# Patient Record
Sex: Female | Born: 1935 | Race: White | Hispanic: No | Smoking: Former smoker
Health system: Southern US, Community
[De-identification: ages and names within clinical notes are randomized; demographics above are authoritative.]

## PROBLEM LIST (undated history)

## (undated) DIAGNOSIS — F329 Major depressive disorder, single episode, unspecified: Secondary | ICD-10-CM

## (undated) DIAGNOSIS — F32A Depression, unspecified: Secondary | ICD-10-CM

## (undated) DIAGNOSIS — E079 Disorder of thyroid, unspecified: Secondary | ICD-10-CM

## (undated) DIAGNOSIS — K449 Diaphragmatic hernia without obstruction or gangrene: Secondary | ICD-10-CM

## (undated) DIAGNOSIS — R413 Other amnesia: Secondary | ICD-10-CM

---
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
## 2011-09-07 ENCOUNTER — Emergency Department (HOSPITAL_BASED_OUTPATIENT_CLINIC_OR_DEPARTMENT_OTHER): Payer: Medicare HMO

## 2011-09-07 ENCOUNTER — Emergency Department (HOSPITAL_BASED_OUTPATIENT_CLINIC_OR_DEPARTMENT_OTHER)
Admission: EM | Admit: 2011-09-07 | Discharge: 2011-09-07 | Disposition: A | Discharge status: Home / Self Care | Payer: Medicare HMO | Attending: Emergency Medicine | Admitting: Emergency Medicine

## 2011-09-07 ENCOUNTER — Encounter (HOSPITAL_BASED_OUTPATIENT_CLINIC_OR_DEPARTMENT_OTHER): Payer: Self-pay | Admitting: Family Medicine

## 2011-09-07 DIAGNOSIS — S52502A Unspecified fracture of the lower end of left radius, initial encounter for closed fracture: Secondary | ICD-10-CM

## 2011-09-07 DIAGNOSIS — S52509A Unspecified fracture of the lower end of unspecified radius, initial encounter for closed fracture: Secondary | ICD-10-CM | POA: Insufficient documentation

## 2011-09-07 DIAGNOSIS — W1809XA Striking against other object with subsequent fall, initial encounter: Secondary | ICD-10-CM | POA: Insufficient documentation

## 2011-09-07 DIAGNOSIS — E079 Disorder of thyroid, unspecified: Secondary | ICD-10-CM | POA: Insufficient documentation

## 2011-09-07 DIAGNOSIS — S52613A Displaced fracture of unspecified ulna styloid process, initial encounter for closed fracture: Secondary | ICD-10-CM

## 2011-09-07 DIAGNOSIS — S0003XA Contusion of scalp, initial encounter: Secondary | ICD-10-CM | POA: Insufficient documentation

## 2011-09-07 DIAGNOSIS — S0083XA Contusion of other part of head, initial encounter: Secondary | ICD-10-CM | POA: Insufficient documentation

## 2011-09-07 DIAGNOSIS — Y92009 Unspecified place in unspecified non-institutional (private) residence as the place of occurrence of the external cause: Secondary | ICD-10-CM | POA: Insufficient documentation

## 2011-09-07 HISTORY — DX: Disorder of thyroid, unspecified: E07.9

## 2011-09-07 MED ORDER — ONDANSETRON HCL 8 MG PO TABS
4.0000 mg | ORAL_TABLET | Freq: Once | ORAL | Status: AC
  Administered 2011-09-07: 4 mg via ORAL
  Filled 2011-09-07: qty 1

## 2011-09-07 MED ORDER — ONDANSETRON HCL 4 MG PO TABS: 4.0000 mg | ORAL_TABLET | Freq: Three times a day (TID) | ORAL | Status: AC | PRN

## 2011-09-07 MED ORDER — OXYCODONE-ACETAMINOPHEN 5-325 MG PO TABS: 1.0000 | ORAL_TABLET | Freq: Four times a day (QID) | ORAL | Status: AC | PRN

## 2011-09-07 MED ORDER — OXYCODONE-ACETAMINOPHEN 5-325 MG PO TABS
1.0000 | ORAL_TABLET | Freq: Once | ORAL | Status: AC
  Administered 2011-09-07: 1 via ORAL
  Filled 2011-09-07: qty 1

## 2011-09-07 NOTE — ED Notes (Signed)
Sugar tong applied to left arm; patient verbalized the splint felt tight; ace wraps removed and splint adjusted.  Ace wrap reapplied.  Patient reports improved comfort with splint.  CMS intact distally post splint application.

## 2011-09-07 NOTE — ED Provider Notes (Signed)
History     CSN: [id number]  Arrival date & time 09/07/11  1315   First MD Initiated Contact with Patient 09/07/11 1325      Chief Complaint  Patient presents with  . Fall    (Consider location/radiation/quality/duration/timing/severity/associated sxs/prior treatment) HPI Pt reports she was in her kitchen just prior to arrival when she lost her balance and fell, hitting her head on a wall and landing on her L wrist. She is complaining of mild burning pain to L side of face and moderate aching pain to L wrist worse with movement. Denies LOC or neck pain.   Past Medical History  Diagnosis Date  . Thyroid disease     History reviewed. No pertinent past surgical history.  No family history on file.  History  Substance Use Topics  . Smoking status: Never Smoker   . Smokeless tobacco: Not on file  . Alcohol Use: No    OB History    Grav Para Term Preterm Abortions TAB SAB Ect Mult Living                  Review of Systems All other systems reviewed and are negative except as noted in HPI.   Allergies  Codeine  Home Medications   Current Outpatient Rx  Name Route Sig Dispense Refill  . LEVOTHYROXINE SODIUM 75 MCG PO TABS Oral Take 75 mcg by mouth daily.    . MULTIVITAMINS PO CAPS Oral Take 1 capsule by mouth daily.      BP 140/72  Pulse 79  Temp 97.7 F (36.5 C) (Oral)  Resp 18  Ht 5\' 3"  (1.6 m)  Wt 155 lb (70.308 kg)  BMI 27.46 kg/m2  SpO2 98%  Physical Exam  Nursing note and vitals reviewed. Constitutional: She is oriented to person, place, and time. She appears well-developed and well-nourished.  HENT:  Head: Normocephalic.       Small abrasion and ecchymosis to L lateral face.   Eyes: EOM are normal. Pupils are equal, round, and reactive to light.  Neck: Normal range of motion. Neck supple.  Cardiovascular: Normal rate, normal heart sounds and intact distal pulses.   Pulmonary/Chest: Effort normal and breath sounds normal.  Abdominal: Bowel sounds  are normal. She exhibits no distension. There is no tenderness.  Musculoskeletal: She exhibits edema and tenderness.       Deformity of L wrist, neurovascularly intact. There is an abrasion to her L elbow, but otherwise her MS exam is normal  Neurological: She is alert and oriented to person, place, and time. She has normal strength. No cranial nerve deficit or sensory deficit.  Skin: Skin is warm and dry. No rash noted.  Psychiatric: She has a normal mood and affect.    ED Course  Procedures (including critical care time)  Labs Reviewed - No data to display Dg Wrist Complete Left  09/07/2011  *RADIOLOGY REPORT*  Clinical Data: Fall.  Pain and deformity.  LEFT WRIST - COMPLETE 3+ VIEW  Comparison: None.  Findings: Comminuted distal radius fracture with apex anterior angulation noted.  There is approximately one half shaft with the posterior displacement of the main distal fracture fragment relative to the proximal.  Fracture line extends into the articular surface.  There is associated ulnar styloid fracture.  IMPRESSION: Comminuted distal radius fracture with intra-articular extension.  Ulnar styloid fracture.  Original Report Authenticated By: ERIC A. MANSELL, M.D.   Ct Head Wo Contrast  09/07/2011  *RADIOLOGY REPORT*  Clinical Data: Fall.  Head injury.  CT HEAD WITHOUT CONTRAST  Technique:  Contiguous axial images were obtained from the base of the skull through the vertex without contrast.  Comparison: None.  Findings: Soft tissue injury lateral to the left orbit.  No underlying fracture.  No intracranial hemorrhage.  Small vessel disease type changes without CT evidence of large acute infarct.  No intracranial mass lesion detected on this unenhanced exam.  No hydrocephalus.  IMPRESSION: Soft tissue injury lateral to the left orbital region without underlying fracture or intracranial hemorrhage.  Original Report Authenticated By: Fuller Canada, M.D.     No diagnosis found.    MDM  Xrays  as above. Placed in sugar tong splint by Tech, reassessed by me. Pt complaining of tight splint, but neurovascularly intact. Splint was readjusted. Pt to follow up with Dr. Melvyn Novas. Pain and nausea meds ordered.        Charles B. Bernette Mayers, MD 09/07/11 1531

## 2011-09-07 NOTE — ED Notes (Signed)
Pt sts she fell about 30 mins ago in her kitchen hitting left side of face on wall and injuring left wrist and elbow. No loc. Obvious deformity to left wrist, abrasion to left elbow. Bruising noted to left cheek and eye.

## 2013-12-02 DIAGNOSIS — G4700 Insomnia, unspecified: Secondary | ICD-10-CM | POA: Diagnosis present

## 2019-02-27 ENCOUNTER — Encounter (HOSPITAL_BASED_OUTPATIENT_CLINIC_OR_DEPARTMENT_OTHER): Payer: Self-pay

## 2019-02-27 ENCOUNTER — Other Ambulatory Visit: Payer: Self-pay

## 2019-02-27 ENCOUNTER — Emergency Department (HOSPITAL_BASED_OUTPATIENT_CLINIC_OR_DEPARTMENT_OTHER): Payer: Medicare HMO

## 2019-02-27 ENCOUNTER — Inpatient Hospital Stay (HOSPITAL_BASED_OUTPATIENT_CLINIC_OR_DEPARTMENT_OTHER)
Admission: EM | Admit: 2019-02-27 | Discharge: 2019-03-03 | DRG: 177 | Disposition: A | Discharge status: Home / Self Care | Payer: Medicare HMO | Attending: Internal Medicine | Admitting: Internal Medicine

## 2019-02-27 DIAGNOSIS — J9601 Acute respiratory failure with hypoxia: Secondary | ICD-10-CM | POA: Diagnosis present

## 2019-02-27 DIAGNOSIS — Z79890 Hormone replacement therapy (postmenopausal): Secondary | ICD-10-CM | POA: Diagnosis not present

## 2019-02-27 DIAGNOSIS — E039 Hypothyroidism, unspecified: Secondary | ICD-10-CM | POA: Diagnosis present

## 2019-02-27 DIAGNOSIS — U071 COVID-19: Secondary | ICD-10-CM | POA: Diagnosis present

## 2019-02-27 DIAGNOSIS — Z79899 Other long term (current) drug therapy: Secondary | ICD-10-CM

## 2019-02-27 DIAGNOSIS — E669 Obesity, unspecified: Secondary | ICD-10-CM | POA: Diagnosis present

## 2019-02-27 DIAGNOSIS — Z6832 Body mass index (BMI) 32.0-32.9, adult: Secondary | ICD-10-CM | POA: Diagnosis not present

## 2019-02-27 DIAGNOSIS — F329 Major depressive disorder, single episode, unspecified: Secondary | ICD-10-CM | POA: Diagnosis present

## 2019-02-27 DIAGNOSIS — J1289 Other viral pneumonia: Secondary | ICD-10-CM | POA: Diagnosis present

## 2019-02-27 DIAGNOSIS — R0902 Hypoxemia: Secondary | ICD-10-CM

## 2019-02-27 DIAGNOSIS — E876 Hypokalemia: Secondary | ICD-10-CM | POA: Diagnosis present

## 2019-02-27 DIAGNOSIS — Z87891 Personal history of nicotine dependence: Secondary | ICD-10-CM

## 2019-02-27 DIAGNOSIS — F0390 Unspecified dementia without behavioral disturbance: Secondary | ICD-10-CM | POA: Diagnosis present

## 2019-02-27 DIAGNOSIS — F32A Depression, unspecified: Secondary | ICD-10-CM | POA: Diagnosis present

## 2019-02-27 HISTORY — DX: Depression, unspecified: F32.A

## 2019-02-27 HISTORY — DX: Diaphragmatic hernia without obstruction or gangrene: K44.9

## 2019-02-27 HISTORY — DX: Other amnesia: R41.3

## 2019-02-27 LAB — CBC WITH DIFFERENTIAL/PLATELET
Abs Immature Granulocytes: 0.06 10*3/uL (ref 0.00–0.07)
Basophils Absolute: 0 10*3/uL (ref 0.0–0.1)
Basophils Relative: 0 %
Eosinophils Absolute: 0 10*3/uL (ref 0.0–0.5)
Eosinophils Relative: 0 %
HCT: 40.2 % (ref 36.0–46.0)
Hemoglobin: 13.3 g/dL (ref 12.0–15.0)
Immature Granulocytes: 1 %
Lymphocytes Relative: 17 %
Lymphs Abs: 1.4 10*3/uL (ref 0.7–4.0)
MCH: 31.9 pg (ref 26.0–34.0)
MCHC: 33.1 g/dL (ref 30.0–36.0)
MCV: 96.4 fL (ref 80.0–100.0)
Monocytes Absolute: 1 10*3/uL (ref 0.1–1.0)
Monocytes Relative: 12 %
Neutro Abs: 5.8 10*3/uL (ref 1.7–7.7)
Neutrophils Relative %: 70 %
Platelets: 272 10*3/uL (ref 150–400)
RBC: 4.17 MIL/uL (ref 3.87–5.11)
RDW: 12.6 % (ref 11.5–15.5)
WBC: 8.2 10*3/uL (ref 4.0–10.5)
nRBC: 0 % (ref 0.0–0.2)

## 2019-02-27 LAB — COMPREHENSIVE METABOLIC PANEL
ALT: 21 U/L (ref 0–44)
AST: 26 U/L (ref 15–41)
Albumin: 4.1 g/dL (ref 3.5–5.0)
Alkaline Phosphatase: 41 U/L (ref 38–126)
Anion gap: 8 (ref 5–15)
BUN: 14 mg/dL (ref 8–23)
CO2: 28 mmol/L (ref 22–32)
Calcium: 8.9 mg/dL (ref 8.9–10.3)
Chloride: 101 mmol/L (ref 98–111)
Creatinine, Ser: 0.78 mg/dL (ref 0.44–1.00)
GFR calc Af Amer: >60 mL/min (ref >60)
GFR calc non Af Amer: >60 mL/min (ref >60)
Glucose, Bld: 112 mg/dL — ABNORMAL HIGH (ref 70–99)
Potassium: 4.1 mmol/L (ref 3.5–5.1)
Sodium: 137 mmol/L (ref 135–145)
Total Bilirubin: 0.8 mg/dL (ref 0.3–1.2)
Total Protein: 7 g/dL (ref 6.5–8.1)

## 2019-02-27 LAB — FIBRINOGEN: Fibrinogen: 437 mg/dL (ref 210–475)

## 2019-02-27 LAB — BRAIN NATRIURETIC PEPTIDE: B Natriuretic Peptide: 57.5 pg/mL (ref 0.0–100.0)

## 2019-02-27 LAB — D-DIMER, QUANTITATIVE: D-Dimer, Quant: 2.05 ug/mL-FEU — ABNORMAL HIGH (ref 0.00–0.50)

## 2019-02-27 LAB — PROCALCITONIN: Procalcitonin: <0.1 ng/mL

## 2019-02-27 LAB — LACTATE DEHYDROGENASE: LDH: 197 U/L — ABNORMAL HIGH (ref 98–192)

## 2019-02-27 LAB — SARS CORONAVIRUS 2 AG (30 MIN TAT): SARS Coronavirus 2 Ag: POSITIVE — AB

## 2019-02-27 LAB — C-REACTIVE PROTEIN: CRP: 1 mg/dL — ABNORMAL HIGH (ref <1.0)

## 2019-02-27 LAB — LACTIC ACID, PLASMA
Lactic Acid, Venous: 1.1 mmol/L (ref 0.5–1.9)
Lactic Acid, Venous: 1 mmol/L (ref 0.5–1.9)

## 2019-02-27 LAB — TRIGLYCERIDES: Triglycerides: 164 mg/dL — ABNORMAL HIGH (ref <150)

## 2019-02-27 LAB — FERRITIN: Ferritin: 85 ng/mL (ref 11–307)

## 2019-02-27 MED ORDER — LACTATED RINGERS IV SOLN: INTRAVENOUS | Status: AC

## 2019-02-27 MED ORDER — DEXAMETHASONE SODIUM PHOSPHATE 10 MG/ML IJ SOLN: 10.0000 mg | INTRAMUSCULAR | Status: DC

## 2019-02-27 MED ORDER — SODIUM CHLORIDE 0.9 % IV SOLN
200.0000 mg | Freq: Once | INTRAVENOUS | Status: AC
  Administered 2019-02-27: 200 mg via INTRAVENOUS
  Filled 2019-02-27: qty 40

## 2019-02-27 MED ORDER — LEVOTHYROXINE SODIUM 50 MCG PO TABS
100.0000 ug | ORAL_TABLET | Freq: Every day | ORAL | Status: DC
  Administered 2019-02-28 – 2019-03-03 (×4): 100 ug via ORAL
  Filled 2019-02-27 (×4): qty 2

## 2019-02-27 MED ORDER — ESCITALOPRAM OXALATE 10 MG PO TABS
10.0000 mg | ORAL_TABLET | Freq: Every day | ORAL | Status: DC
  Administered 2019-02-28 – 2019-03-03 (×4): 10 mg via ORAL
  Filled 2019-02-27 (×5): qty 1

## 2019-02-27 MED ORDER — TRAZODONE HCL 50 MG PO TABS
50.0000 mg | ORAL_TABLET | Freq: Every day | ORAL | Status: DC
  Administered 2019-02-27 – 2019-03-02 (×4): 50 mg via ORAL
  Filled 2019-02-27 (×4): qty 1

## 2019-02-27 MED ORDER — DEXAMETHASONE SODIUM PHOSPHATE 10 MG/ML IJ SOLN
10.0000 mg | Freq: Once | INTRAMUSCULAR | Status: AC
  Administered 2019-02-27: 10 mg via INTRAVENOUS
  Filled 2019-02-27: qty 1

## 2019-02-27 MED ORDER — LORAZEPAM 0.5 MG PO TABS
0.5000 mg | ORAL_TABLET | Freq: Four times a day (QID) | ORAL | Status: DC | PRN
  Administered 2019-02-28 – 2019-03-01 (×2): 0.5 mg via ORAL
  Filled 2019-02-27 (×2): qty 1

## 2019-02-27 MED ORDER — ENOXAPARIN SODIUM 40 MG/0.4ML ~~LOC~~ SOLN
40.0000 mg | SUBCUTANEOUS | Status: DC
  Administered 2019-02-27 – 2019-03-02 (×4): 40 mg via SUBCUTANEOUS
  Filled 2019-02-27 (×4): qty 0.4

## 2019-02-27 MED ORDER — SODIUM CHLORIDE 0.9 % IV SOLN
100.0000 mg | Freq: Every day | INTRAVENOUS | Status: AC
  Administered 2019-02-28 – 2019-03-03 (×4): 100 mg via INTRAVENOUS
  Filled 2019-02-27 (×5): qty 20

## 2019-02-27 NOTE — ED Provider Notes (Signed)
[facility] EMERGENCY DEPARTMENT Provider Note   CSN: [id number] Arrival date & time: 02/27/19  1422     History Chief Complaint  Patient presents with  . Cough    Colleen Garrison is a 85 y.o. female presents to the ER for evaluation of shortness of breath.  Onset 3 to 4 days ago.  Described as feeling winded.  Has associated fatigue.  Today she noticed some phlegm in her throat, postnasal drip causing her to cough.  Otherwise denies any other associate symptoms.  No fever, nasal congestion, sore throat, chest pain, nausea, vomiting, diarrhea, abdominal pain.  No lower extremity swelling.  Denies orthopnea or PND.  Denies lightheadedness.  Patient lives alone in an apartment.  Denies sick contacts.  She goes out in the community as needed for grocery shopping but has been compliant with mask wearing.  Denies any known cardiac or pulmonary illnesses.  She only takes 1 medicine for hypothyroid.  Remote history of tobacco use more than 20 years ago.  HPI     Past Medical History:  Diagnosis Date  . Thyroid disease     Patient Active Problem List   Diagnosis Date Noted  . COVID-19 virus infection 02/27/2019    History reviewed. No pertinent surgical history.   OB History   No obstetric history on file.     No family history on file.  Social History   Tobacco Use  . Smoking status: Former Games developermoker  . Smokeless tobacco: Never Used  Substance Use Topics  . Alcohol use: No  . Drug use: Never    Home Medications Prior to Admission medications   Medication Sig Start Date End Date Taking? Authorizing Provider  levothyroxine (SYNTHROID, LEVOTHROID) 75 MCG tablet Take 75 mcg by mouth daily.    [provider]  Multiple Vitamin (MULTIVITAMIN) capsule Take 1 capsule by mouth daily.    [provider]    Allergies    Codeine  Review of Systems   Review of Systems  HENT: Positive for congestion.   Respiratory: Positive for shortness of breath.    All other systems reviewed and are negative.   Physical Exam Updated Vital Signs BP (!) 157/63   Pulse 78   Temp 99 F (37.2 C) (Oral)   Resp 16   SpO2 94%   Physical Exam Vitals and nursing note reviewed.  Constitutional:      General: She is not in acute distress.    Appearance: She is well-developed.     Comments: Nontoxic.  HENT:     Head: Normocephalic and atraumatic.     Right Ear: External ear normal.     Left Ear: External ear normal.     Nose: Nose normal.     Mouth/Throat:     Comments: Moist mucous membranes.  Oropharynx tonsils normal.  Voice noted to be slightly hoarse.  Patient states this started this morning. Eyes:     General: No scleral icterus.    Conjunctiva/sclera: Conjunctivae normal.  Cardiovascular:     Rate and Rhythm: Normal rate and regular rhythm.     Heart sounds: Normal heart sounds.     Comments: 1+ DP and radial pulses.  No lower extremity edema.  No calf tenderness. Pulmonary:     Effort: Pulmonary effort is normal.     Comments: SPO2 is 90% on room air with good [place] upon entering the room, at rest.  This fluctuates between 88% to 93% at rest and during conversation.  Speaking  in full sentences.  No other obvious increased work of breathing or respiratory distress.  No wheezing, crackles.  Diminished air sounds in lower lobes, difficult exam due to body habitus. Musculoskeletal:        General: No deformity. Normal range of motion.     Cervical back: Normal range of motion and neck supple.  Skin:    General: Skin is warm and dry.     Capillary Refill: Capillary refill takes less than 2 seconds.  Neurological:     Mental Status: She is alert and oriented to person, place, and time.  Psychiatric:        Behavior: Behavior normal.        Thought Content: Thought content normal.        Judgment: Judgment normal.     ED Results / Procedures / Treatments   Labs (all labs ordered are listed, but only abnormal results are displayed)  Labs Reviewed  SARS CORONAVIRUS 2 AG (30 MIN TAT) - Abnormal; Notable for the following components:      Result Value   SARS Coronavirus 2 Ag POSITIVE (*)    All other components within normal limits  COMPREHENSIVE METABOLIC PANEL - Abnormal; Notable for the following components:   Glucose, Bld 112 (*)    All other components within normal limits  D-DIMER, QUANTITATIVE (NOT AT [facility]) - Abnormal; Notable for the following components:   D-Dimer, Quant 2.05 (*)    All other components within normal limits  CULTURE, BLOOD (ROUTINE X 2)  CULTURE, BLOOD (ROUTINE X 2)  CBC WITH DIFFERENTIAL/PLATELET  BRAIN NATRIURETIC PEPTIDE  LACTIC ACID, PLASMA  LACTIC ACID, PLASMA  PROCALCITONIN  FERRITIN  TRIGLYCERIDES  C-REACTIVE PROTEIN  FIBRINOGEN  LACTATE DEHYDROGENASE    EKG EKG Interpretation  Date/Time:  Wednesday February 27 2019 16:07:49 EST Ventricular Rate:  68 PR Interval:    QRS Duration: 94 QT Interval:  410 QTC Calculation: 436 R Axis:   82 Text Interpretation: Sinus rhythm Borderline right axis deviation Consider left ventricular hypertrophy Baseline wander in lead(s) V2 Confirmed by Lennice Sites [phone]) on 02/27/2019 4:10:12 PM   Radiology DG Chest Portable 1 View  Result Date: 02/27/2019 CLINICAL DATA:  Shortness of breath with hypoxia and congestion. Former smoker. EXAM: PORTABLE CHEST 1 VIEW COMPARISON:  Radiographs 09/04/2018 FINDINGS: 1624 hours. The heart size and mediastinal contours are stable. There is a large hiatal hernia. The heart size is grossly normal. There is aortic atherosclerosis. There is possible mildly increased atelectasis in the left lower lobe medially. The lungs are otherwise clear. There is no pleural effusion, pneumothorax or edema. The bones appear unremarkable. Telemetry leads overlie the chest. IMPRESSION: No definite acute findings. Possible mildly increased left lower lobe atelectasis adjacent to a large hiatal hernia. Overall appearance is  similar to previous study of 6 months ago. Electronically Signed   By: Richardean Sale M.D.   On: 02/27/2019 17:01    Procedures .Critical Care Performed by: Kinnie Feil, PA-C Authorized by: Kinnie Feil, PA-C   Critical care provider statement:    Critical care time (minutes):  45   Critical care was necessary to treat or prevent imminent or life-threatening deterioration of the following conditions:  Respiratory failure   Critical care was time spent personally by me on the following activities:  Discussions with consultants, evaluation of patient's response to treatment, examination of patient, ordering and performing treatments and interventions, ordering and review of laboratory studies, ordering and review of radiographic studies, pulse  oximetry, re-evaluation of patient's condition, obtaining history from patient or surrogate, review of old charts and development of treatment plan with patient or surrogate   I assumed direction of critical care for this patient from another provider in my specialty: no     (including critical care time)  Medications Ordered in ED Medications  dexamethasone (DECADRON) injection 10 mg (has no administration in time range)  lactated ringers infusion (has no administration in time range)  dexamethasone (DECADRON) injection 10 mg (10 mg Intravenous Given 02/27/19 1749)    ED Course  I have reviewed the triage vital signs and the nursing notes.  Pertinent labs & imaging results that were available during my care of the patient were reviewed by me and considered in my medical decision making (see chart for details).  Clinical Course as of Feb 26 1834  Wed Feb 27, 2019  1808 D-Dimer, Quant(!): 2.05 [CG]  1808 SARS Coronavirus 2 Ag(!): POSITIVE [CG]    Clinical Course User Index [CG] Jerrell Mylar   MDM Rules/Calculators/A&P                      85 year old female with only past medical history of hypothyroidism here with  shortness of breath with throat/chest congestion.  SPO2 88 to 93% at rest on room air.  Does not usually wear oxygen at home.  No known cardiac or lung disease.  Given symptoms, highest on DDX is viral LRI, COVID-19, CAP.  She has no chest pain, doubt ACS, PE.  No history of heart failure, signs of hypervolemia on exam and CHF is less likely.  Labs, chest x-ray, Covid POC.  She is on 2 L [place].  Anticipate admission.  1809: Patient reevaluated, no clinical decline.  On 2 L  and stable.  Discussed positive Covid test and plan to admit due to low oxygen.  ER work-up personally reviewed, pretty reassuring. Inflammatory markers elevated.  Chest x-ray questionable atelectasis.    Decadron given.   Will request admission for hypoxia due to COVID-19 illness.  Shared with EDP.   1834: Spoke to Dr Thedore Mins at [facility] who has accepted patient, he will put in orders.  Final Clinical Impression(s) / ED Diagnoses Final diagnoses:  COVID-19 virus infection  Hypoxemia    Rx / DC Orders ED Discharge Orders    None       Liberty Handy, PA-C 02/27/19 1835    Virgina Norfolk, DO 02/27/19 1950    Virgina Norfolk, DO 02/27/19 1951

## 2019-02-27 NOTE — ED Notes (Signed)
Unable to obtain 2nd BC at this time.

## 2019-02-27 NOTE — ED Notes (Signed)
Spoke with Colleen Garrison, patient's son, and informed him if they can designate somebody to call to get an information about their mother. I informed him that everyone of his siblings has been calling the nurse to get an information.   Mr. Colleen Garrison stated that he will be the one to call and give information to all his siblings.  I also informed him that I will make sure that the nurse will give him a call when his mother has a bed assignment and when the ambulance is here to transfer her.  Mr. Colleen Garrison is very appreciative.

## 2019-02-27 NOTE — ED Notes (Signed)
[facility] contacted regarding remdesivir. He stated he will look into her transport time and have the medication sent over if it looks like it will be an extended wait.

## 2019-02-27 NOTE — ED Notes (Signed)
Pt's primary family contact is [employer] who is the pt's POA. Cell phone is [phone]

## 2019-02-27 NOTE — Progress Notes (Signed)
Colleen Garrison, is a 85 y.o. female, DOB - 11/21/1931, MQK:[id number]  Healthy 85 year old female with past medical history of hypothyroidism presented to [place] ER with chief complaints of low-grade fever, cough and shortness of breath.  Diagnosed with COVID-19 pneumonia with mild hypoxic respiratory failure requiring oxygen upon ambulation.  Prelim labs are stable except D-dimer slightly high, CRP pending.  Placed on gentle IV fluids, Decadron, remdesivir.  Will come to MedSurg bed at [facility].   Vitals:   02/27/19 1501 02/27/19 1510 02/27/19 1600 02/27/19 1745  BP:   (!) 156/82 (!) 157/63  Pulse:  71 69 78  Resp:   20 16  Temp:      TempSrc:      SpO2: 95% (!) 89% 99% 94%        Data Review   Micro Results Recent Results (from the past 240 hour(s))  SARS Coronavirus 2 Ag (30 min TAT) - Nasal Swab (BD Veritor Kit)     Status: Abnormal   Collection Time: 02/27/19  4:16 PM   Specimen: Nasal Swab (BD Veritor Kit)  Result Value Ref Range Status   SARS Coronavirus 2 Ag POSITIVE (A) NEGATIVE Final    Comment: RESULT CALLED TO, READ BACK BY AND VERIFIED WITH: CALLED TO C.REED RN AT [phone] ON [id number] BY SROY (NOTE) SARS-CoV-2 antigen PRESENT. Positive results indicate the presence of viral antigens, but clinical correlation with patient history and other diagnostic information is necessary to determine patient infection status.  Positive results do not rule out bacterial infection or co-infection  with other viruses. False positive results are rare but can occur, and confirmatory RT-PCR testing may be appropriate in some circumstances. The expected result is Negative. Fact Sheet for Patients: PodPark.tn Fact Sheet for Providers: GiftContent.is  This test is not yet approved or cleared by the [place] FDA and  has been  authorized for detection and/or diagnosis of SARS-CoV-2 by FDA under an Emergency Use Authorization (EUA).  This EUA will remain in effect (meaning this test can be used) for the duration of  the COVID- 19 declaration under Section 564(b)(1) of the Act, 21 U.S.C. section 360bbb-3(b)(1), unless the authorization is terminated or revoked sooner. Performed at [facility], [address] [place], [place] [address]     Radiology Reports DG Chest Portable 1 View  Result Date: 02/27/2019 CLINICAL DATA:  Shortness of breath with hypoxia and congestion. Former smoker. EXAM: PORTABLE CHEST 1 VIEW COMPARISON:  Radiographs 09/04/2018 FINDINGS: 1624 hours. The heart size and mediastinal contours are stable. There is a large hiatal hernia. The heart size is grossly normal. There is aortic atherosclerosis. There is possible mildly increased atelectasis in the left lower lobe medially. The lungs are otherwise clear. There is no pleural effusion, pneumothorax or edema. The bones appear unremarkable. Telemetry leads overlie the chest. IMPRESSION: No definite acute findings. Possible mildly increased left lower lobe atelectasis adjacent to a large hiatal hernia. Overall appearance is similar to previous study of 6 months ago. Electronically Signed   By: Richardean Sale M.D.   On: 02/27/2019 17:01    CBC Recent Labs  Lab 02/27/19 1616  WBC 8.2  HGB 13.3  HCT 40.2  PLT 272  MCV 96.4  MCH 31.9  MCHC 33.1  RDW 12.6  LYMPHSABS 1.4  MONOABS 1.0  EOSABS 0.0  BASOSABS 0.0    Chemistries  Recent Labs  Lab 02/27/19 1616  NA 137  K 4.1  CL 101  CO2 28  GLUCOSE 112*  BUN 14  CREATININE 0.78  CALCIUM 8.9  AST 26  ALT 21  ALKPHOS 41  BILITOT 0.8   ------------------------------------------------------------------------------------------------------------------ CrCl cannot be calculated (Unknown ideal  weight.). ------------------------------------------------------------------------------------------------------------------ No results for input(s): HGBA1C in the last 72 hours. ------------------------------------------------------------------------------------------------------------------ No results for input(s): CHOL, HDL, LDLCALC, TRIG, CHOLHDL, LDLDIRECT in the last 72 hours. ------------------------------------------------------------------------------------------------------------------ No results for input(s): TSH, T4TOTAL, T3FREE, THYROIDAB in the last 72 hours.  Invalid input(s): FREET3 ------------------------------------------------------------------------------------------------------------------ No results for input(s): VITAMINB12, FOLATE, FERRITIN, TIBC, IRON, RETICCTPCT in the last 72 hours.  Coagulation profile No results for input(s): INR, PROTIME in the last 168 hours.  Recent Labs    02/27/19 1615  DDIMER 2.05*    Cardiac Enzymes No results for input(s): CKMB, TROPONINI, MYOGLOBIN in the last 168 hours.  Invalid input(s): CK ------------------------------------------------------------------------------------------------------------------ Invalid input(s): POCBNP   Signature  Lala Lund M.D on 02/27/2019 at 6:34 PM   -  To page go to [email or web address]

## 2019-02-27 NOTE — ED Notes (Signed)
Pt up to [facility] without difficulty, voided . Given crackers and sprite , clothes bagged and tagged

## 2019-02-27 NOTE — ED Provider Notes (Signed)
Medical screening examination/treatment/procedure(s) were conducted as a shared visit with non-physician practitioner(s) and myself.  I personally evaluated the patient during the encounter. Briefly, the patient is a 85 y.o. female with history of thyroid disease who presents to the ED with cough, shortness of breath.  Patient with normal vitals.  No fever.  However she is hypoxic to the upper 80s at rest and worse with ambulation in the room.  Was placed on 2 L of oxygen with improvement of hypoxia.  No signs of respiratory distress.  Chest x-ray with possibly some atelectasis versus inflammation in the left side of the lung.  Is positive for coronavirus.  No significant anemia, electrolyte abnormality.  Inflammatory markers are mildly elevated including D-dimer.  Patient was given Decadron.  Will admit for further care given hypoxia in the setting of coronavirus.  This chart was dictated using voice recognition software.  Despite best efforts to proofread,  errors can occur which can change the documentation meaning.  Colleen Garrison was evaluated in Emergency Department on 02/27/2019 for the symptoms described in the history of present illness. She was evaluated in the context of the global COVID-19 pandemic, which necessitated consideration that the patient might be at risk for infection with the SARS-CoV-2 virus that causes COVID-19. Institutional protocols and algorithms that pertain to the evaluation of patients at risk for COVID-19 are in a state of rapid change based on information released by regulatory bodies including the CDC and federal and state organizations. These policies and algorithms were followed during the patient's care in the ED.    EKG Interpretation  Date/Time:  Wednesday February 27 2019 16:07:49 EST Ventricular Rate:  68 PR Interval:    QRS Duration: 94 QT Interval:  410 QTC Calculation: 436 R Axis:   82 Text Interpretation: Sinus rhythm Borderline right axis deviation  Consider left ventricular hypertrophy Baseline wander in lead(s) V2 Confirmed by Lennice Sites [phone]) on 02/27/2019 4:10:12 PM           Lennice Sites, DO 02/27/19 1817

## 2019-02-27 NOTE — ED Triage Notes (Signed)
Pt c/o flu like sx started yesterday-denies covid testing and exposure-NAD-to triage in w/c

## 2019-02-28 ENCOUNTER — Encounter (HOSPITAL_COMMUNITY): Payer: Self-pay | Admitting: Internal Medicine

## 2019-02-28 DIAGNOSIS — E039 Hypothyroidism, unspecified: Secondary | ICD-10-CM | POA: Diagnosis present

## 2019-02-28 DIAGNOSIS — J9601 Acute respiratory failure with hypoxia: Secondary | ICD-10-CM | POA: Diagnosis present

## 2019-02-28 DIAGNOSIS — F32A Depression, unspecified: Secondary | ICD-10-CM | POA: Diagnosis present

## 2019-02-28 DIAGNOSIS — F329 Major depressive disorder, single episode, unspecified: Secondary | ICD-10-CM

## 2019-02-28 DIAGNOSIS — U071 COVID-19: Secondary | ICD-10-CM | POA: Diagnosis present

## 2019-02-28 LAB — CBC WITH DIFFERENTIAL/PLATELET
Abs Immature Granulocytes: 0.02 10*3/uL (ref 0.00–0.07)
Basophils Absolute: 0 10*3/uL (ref 0.0–0.1)
Basophils Relative: 0 %
Eosinophils Absolute: 0 10*3/uL (ref 0.0–0.5)
Eosinophils Relative: 0 %
HCT: 39.2 % (ref 36.0–46.0)
Hemoglobin: 12.8 g/dL (ref 12.0–15.0)
Immature Granulocytes: 0 %
Lymphocytes Relative: 16 %
Lymphs Abs: 0.9 10*3/uL (ref 0.7–4.0)
MCH: 31.4 pg (ref 26.0–34.0)
MCHC: 32.7 g/dL (ref 30.0–36.0)
MCV: 96.1 fL (ref 80.0–100.0)
Monocytes Absolute: 0.4 10*3/uL (ref 0.1–1.0)
Monocytes Relative: 7 %
Neutro Abs: 4.2 10*3/uL (ref 1.7–7.7)
Neutrophils Relative %: 77 %
Platelets: 282 10*3/uL (ref 150–400)
RBC: 4.08 MIL/uL (ref 3.87–5.11)
RDW: 12.6 % (ref 11.5–15.5)
WBC: 5.5 10*3/uL (ref 4.0–10.5)
nRBC: 0 % (ref 0.0–0.2)

## 2019-02-28 LAB — COMPREHENSIVE METABOLIC PANEL
ALT: 25 U/L (ref 0–44)
AST: 31 U/L (ref 15–41)
Albumin: 4 g/dL (ref 3.5–5.0)
Alkaline Phosphatase: 36 U/L — ABNORMAL LOW (ref 38–126)
Anion gap: 11 (ref 5–15)
BUN: 19 mg/dL (ref 8–23)
CO2: 28 mmol/L (ref 22–32)
Calcium: 8.9 mg/dL (ref 8.9–10.3)
Chloride: 102 mmol/L (ref 98–111)
Creatinine, Ser: 0.73 mg/dL (ref 0.44–1.00)
GFR calc Af Amer: >60 mL/min (ref >60)
GFR calc non Af Amer: >60 mL/min (ref >60)
Glucose, Bld: 160 mg/dL — ABNORMAL HIGH (ref 70–99)
Potassium: 2.8 mmol/L — ABNORMAL LOW (ref 3.5–5.1)
Sodium: 141 mmol/L (ref 135–145)
Total Bilirubin: 1 mg/dL (ref 0.3–1.2)
Total Protein: 7.5 g/dL (ref 6.5–8.1)

## 2019-02-28 LAB — SAMPLE TO BLOOD BANK

## 2019-02-28 LAB — C-REACTIVE PROTEIN: CRP: 3.5 mg/dL — ABNORMAL HIGH (ref <1.0)

## 2019-02-28 LAB — GLUCOSE, CAPILLARY
Glucose-Capillary: 79 mg/dL (ref 70–99)
Glucose-Capillary: 120 mg/dL — ABNORMAL HIGH (ref 70–99)

## 2019-02-28 LAB — D-DIMER, QUANTITATIVE: D-Dimer, Quant: 1.96 ug/mL-FEU — ABNORMAL HIGH (ref 0.00–0.50)

## 2019-02-28 LAB — FERRITIN: Ferritin: 86 ng/mL (ref 11–307)

## 2019-02-28 MED ORDER — INSULIN ASPART 100 UNIT/ML ~~LOC~~ SOLN
0.0000 [IU] | Freq: Three times a day (TID) | SUBCUTANEOUS | Status: DC
  Administered 2019-03-01: 1 [IU] via SUBCUTANEOUS

## 2019-02-28 MED ORDER — POTASSIUM CHLORIDE CRYS ER 20 MEQ PO TBCR
40.0000 meq | EXTENDED_RELEASE_TABLET | ORAL | Status: AC
  Administered 2019-02-28 (×2): 40 meq via ORAL
  Filled 2019-02-28 (×2): qty 2

## 2019-02-28 MED ORDER — ASCORBIC ACID 500 MG PO TABS
500.0000 mg | ORAL_TABLET | Freq: Every day | ORAL | Status: DC
  Administered 2019-02-28 – 2019-03-03 (×4): 500 mg via ORAL
  Filled 2019-02-28 (×4): qty 1

## 2019-02-28 MED ORDER — ZINC SULFATE 220 (50 ZN) MG PO CAPS
220.0000 mg | ORAL_CAPSULE | Freq: Every day | ORAL | Status: DC
  Administered 2019-02-28 – 2019-03-03 (×4): 220 mg via ORAL
  Filled 2019-02-28 (×4): qty 1

## 2019-02-28 MED ORDER — ACETAMINOPHEN 325 MG PO TABS: 650.0000 mg | ORAL_TABLET | Freq: Four times a day (QID) | ORAL | Status: DC | PRN

## 2019-02-28 MED ORDER — DOCUSATE SODIUM 100 MG PO CAPS
100.0000 mg | ORAL_CAPSULE | Freq: Every day | ORAL | Status: DC
  Administered 2019-02-28 – 2019-03-03 (×4): 100 mg via ORAL
  Filled 2019-02-28 (×4): qty 1

## 2019-02-28 MED ORDER — GUAIFENESIN-DM 100-10 MG/5ML PO SYRP: 10.0000 mL | ORAL_SOLUTION | ORAL | Status: DC | PRN

## 2019-02-28 MED ORDER — IPRATROPIUM-ALBUTEROL 20-100 MCG/ACT IN AERS
1.0000 | INHALATION_SPRAY | Freq: Four times a day (QID) | RESPIRATORY_TRACT | Status: DC
  Administered 2019-02-28 – 2019-03-03 (×12): 1 via RESPIRATORY_TRACT
  Filled 2019-02-28: qty 4

## 2019-02-28 MED ORDER — ENOXAPARIN SODIUM 40 MG/0.4ML ~~LOC~~ SOLN: 40.0000 mg | SUBCUTANEOUS | Status: DC

## 2019-02-28 MED ORDER — DEXAMETHASONE 6 MG PO TABS
6.0000 mg | ORAL_TABLET | ORAL | Status: DC
  Administered 2019-02-28 – 2019-03-02 (×3): 6 mg via ORAL
  Filled 2019-02-28 (×3): qty 1

## 2019-02-28 MED ORDER — HYDROCOD POLST-CPM POLST ER 10-8 MG/5ML PO SUER: 5.0000 mL | Freq: Two times a day (BID) | ORAL | Status: DC | PRN

## 2019-02-28 NOTE — Progress Notes (Signed)
PROGRESS NOTE                                                                                                                                                                                                             Patient Demographics:    Colleen Garrison, is a 85 y.o. female, DOB - [birth year], GEX:[id number]  Outpatient Primary MD for the patient is Theodis Sato, Fransico Meadow., MD   Admit date - 02/27/2019   LOS - 1  Chief Complaint  Patient presents with  . Cough       Brief Narrative: Patient is a 85 y.o. female with PMHx of hypothyroidism-presented with low-grade fever, cough and shortness of breath-she was found to have acute hypoxic respiratory failure secondary to COVID-19 pneumonia.  See below for further details.   Subjective:    Colleen Garrison today feels much better-she was on just 2 L of oxygen.   Assessment  & Plan :   Acute Hypoxic Resp Failure due to Covid 19 Viral pneumonia: She is stable on just 2 L of oxygen-her CRP is mildly elevated but creeping up.  We will just continue with steroids and remdesivir for now.  Continue attempts to take her off oxygen.  Follow inflammatory markers.  Fever: afebrile  O2 requirements:  SpO2: 98 % O2 Flow Rate (L/min): 2 L/min   COVID-19 Labs: Recent Labs    02/27/19 1614 02/27/19 1615 02/27/19 1743 02/28/19 1023  DDIMER  --  2.05*  --   --   FERRITIN 85  --   --  86  LDH  --   --  197*  --   CRP 1.0*  --   --  3.5*       Component Value Date/Time   BNP 57.5 02/27/2019 1616    Recent Labs  Lab 02/27/19 1615  PROCALCITON <0.10    No results found for: SARSCOV2NAA   COVID-19 Medications: Steroids: 12/23>> Remdesivir: 12/23>>  Other medications: Diuretics:Euvolemic-no need for lasix Antibiotics:Not needed as no evidence of bacterial infection Insulin: Monitor CBGs closely on SSI while on steroids.  Check A1c  Prone/Incentive Spirometry:  encouragedincentive spirometry use 3-4/hour.  DVT Prophylaxis  :  Lovenox   Hypokalemia: Replete and recheck.  Check magnesium with morning labs.  Hypothyroidism: Continue Synthroid  Depression: Appears stable-continue with Lexapro and trazodone.  Obesity: Estimated body  mass index is 32.77 kg/m as calculated from the following:   Height as of this encounter: 5' 3"  (1.6 m).   Weight as of this encounter: 83.9 kg.    Consults  :  None  Procedures  :  None  ABG: No results found for: PHART, PCO2ART, PO2ART, HCO3, TCO2, ACIDBASEDEF, O2SAT  Vent Settings: N/A  Condition - Stable  Family Communication  :  Daughter updated over the phone  Code Status :  Full Code  Diet :  Diet Order            Diet regular Room service appropriate? Yes; Fluid consistency: Thin  Diet effective now               Disposition Plan  :  Remain hospitalized  Barriers to discharge: Hypoxia requiring O2 supplementation/complete 5 days of IV Remdesivir  Antimicorbials  :    Anti-infectives (From admission, onward)   Start     Dose/Rate Route Frequency Ordered Stop   02/28/19 1600  remdesivir 100 mg in sodium chloride 0.9 % 100 mL IVPB     100 mg 200 mL/hr over 30 Minutes Intravenous Daily 02/27/19 2107 03/04/19 0959   02/27/19 2000  remdesivir 200 mg in sodium chloride 0.9% 250 mL IVPB     200 mg 580 mL/hr over 30 Minutes Intravenous Once 02/27/19 1843 02/27/19 2100      Inpatient Medications  Scheduled Meds: . vitamin C  500 mg Oral Daily  . dexamethasone  6 mg Oral Q24H  . docusate sodium  100 mg Oral Daily  . enoxaparin (LOVENOX) injection  40 mg Subcutaneous Q24H  . escitalopram  10 mg Oral Daily  . Ipratropium-Albuterol  1 puff Inhalation Q6H  . levothyroxine  100 mcg Oral QAC breakfast  . traZODone  50 mg Oral QHS  . zinc sulfate  220 mg Oral Daily   Continuous Infusions: . remdesivir 100 mg in NS 100 mL     PRN Meds:.acetaminophen, chlorpheniramine-HYDROcodone,  guaiFENesin-dextromethorphan, LORazepam   Time Spent in minutes  25 See all Orders from today for further details   Oren Binet M.D on 02/28/2019 at 12:38 PM  To page go to [email or web address] - use universal password  [facility] -  Office  [phone]    Objective:   Vitals:   02/28/19 0430 02/28/19 0445 02/28/19 0737 02/28/19 1148  BP: (!) 112/48  136/62 128/60  Pulse: 61  60 71  Resp: 16  18 20   Temp: 98.2 F (36.8 C)  (!) 97.4 F (36.3 C) 98.2 F (36.8 C)  TempSrc: Oral  Axillary Oral  SpO2: (!) 89% 91% 95% 98%  Weight:      Height:        Wt Readings from Last 3 Encounters:  02/28/19 83.9 kg  09/07/11 70.3 kg     Intake/Output Summary (Last 24 hours) at 02/28/2019 1238 Last data filed at 02/28/2019 0600 Gross per 24 hour  Intake 938.55 ml  Output 0 ml  Net 938.55 ml     Physical Exam Gen Exam:Alert awake-not in any distress HEENT:atraumatic, normocephalic Chest: B/L clear to auscultation anteriorly CVS:S1S2 regular Abdomen:soft non tender, non distended Extremities:no edema Neurology: Non focal Skin: no rash   Data Review:    CBC Recent Labs  Lab 02/27/19 1616 02/28/19 1023  WBC 8.2 5.5  HGB 13.3 12.8  HCT 40.2 39.2  PLT 272 282  MCV 96.4 96.1  MCH 31.9 31.4  MCHC 33.1 32.7  RDW 12.6 12.6  LYMPHSABS 1.4 0.9  MONOABS 1.0 0.4  EOSABS 0.0 0.0  BASOSABS 0.0 0.0    Chemistries  Recent Labs  Lab 02/27/19 1616 02/28/19 1023  NA 137 141  K 4.1 2.8*  CL 101 102  CO2 28 28  GLUCOSE 112* 160*  BUN 14 19  CREATININE 0.78 0.73  CALCIUM 8.9 8.9  AST 26 31  ALT 21 25  ALKPHOS 41 36*  BILITOT 0.8 1.0   ------------------------------------------------------------------------------------------------------------------ Recent Labs    02/27/19 1615  TRIG 164*    No results found for: HGBA1C ------------------------------------------------------------------------------------------------------------------ No results for  input(s): TSH, T4TOTAL, T3FREE, THYROIDAB in the last 72 hours.  Invalid input(s): FREET3 ------------------------------------------------------------------------------------------------------------------ Recent Labs    02/27/19 1614 02/28/19 1023  FERRITIN 85 86    Coagulation profile No results for input(s): INR, PROTIME in the last 168 hours.  Recent Labs    02/27/19 1615  DDIMER 2.05*    Cardiac Enzymes No results for input(s): CKMB, TROPONINI, MYOGLOBIN in the last 168 hours.  Invalid input(s): CK ------------------------------------------------------------------------------------------------------------------    Component Value Date/Time   BNP 57.5 02/27/2019 1616    Micro Results Recent Results (from the past 240 hour(s))  SARS Coronavirus 2 Ag (30 min TAT) - Nasal Swab (BD Veritor Kit)     Status: Abnormal   Collection Time: 02/27/19  4:16 PM   Specimen: Nasal Swab (BD Veritor Kit)  Result Value Ref Range Status   SARS Coronavirus 2 Ag POSITIVE (A) NEGATIVE Final    Comment: RESULT CALLED TO, READ BACK BY AND VERIFIED WITH: CALLED TO C.REED RN AT [phone] ON [id number] BY SROY (NOTE) SARS-CoV-2 antigen PRESENT. Positive results indicate the presence of viral antigens, but clinical correlation with patient history and other diagnostic information is necessary to determine patient infection status.  Positive results do not rule out bacterial infection or co-infection  with other viruses. False positive results are rare but can occur, and confirmatory RT-PCR testing may be appropriate in some circumstances. The expected result is Negative. Fact Sheet for Patients: PodPark.tn Fact Sheet for Providers: GiftContent.is  This test is not yet approved or cleared by the [place] FDA and  has been authorized for detection and/or diagnosis of SARS-CoV-2 by FDA under an Emergency Use Authorization (EUA).  This EUA  will remain in effect (meaning this test can be used) for the duration of  the COVID- 19 declaration under Section 564(b)(1) of the Act, 21 U.S.C. section 360bbb-3(b)(1), unless the authorization is terminated or revoked sooner. Performed at [facility], [address] [place], [place] [address]   Blood Culture (routine x 2)     Status: None (Preliminary result)   Collection Time: 02/27/19  5:43 PM   Specimen: BLOOD  Result Value Ref Range Status   Specimen Description   Final    BLOOD RIGHT ANTECUBITAL Performed at [facility], [address] [place], [place] [address]    Special Requests   Final    BOTTLES DRAWN AEROBIC AND ANAEROBIC Blood Culture adequate volume Performed at [facility], [address] [place], [place] [address]    Culture   Final    NO GROWTH < 12 HOURS Performed at [facility], [address] [place], [place] [address]    Report Status PENDING  Incomplete    Radiology Reports DG Chest Portable 1 View  Result Date: 02/27/2019 CLINICAL DATA:  Shortness of breath with hypoxia and congestion. Former smoker. EXAM:  PORTABLE CHEST 1 VIEW COMPARISON:  Radiographs 09/04/2018 FINDINGS: 1624 hours. The heart size and mediastinal contours are stable. There is a large hiatal hernia. The heart size is grossly normal. There is aortic atherosclerosis. There is possible mildly increased atelectasis in the left lower lobe medially. The lungs are otherwise clear. There is no pleural effusion, pneumothorax or edema. The bones appear unremarkable. Telemetry leads overlie the chest. IMPRESSION: No definite acute findings. Possible mildly increased left lower lobe atelectasis adjacent to a large hiatal hernia. Overall appearance is similar to previous study of 6 months ago. Electronically Signed   By: Richardean Sale M.D.   On: 02/27/2019 17:01

## 2019-02-28 NOTE — Plan of Care (Signed)
  Problem: Education: Goal: Knowledge of risk factors and measures for prevention of condition will improve Outcome: Progressing   Problem: Coping: Goal: Psychosocial and spiritual needs will be supported Outcome: Progressing   Problem: Respiratory: Goal: Will maintain a patent airway Outcome: Progressing Goal: Complications related to the disease process, condition or treatment will be avoided or minimized Outcome: Progressing   

## 2019-02-28 NOTE — H&P (Signed)
TRH H&P   Patient Demographics:    Colleen Garrison, is a 85 y.o. female  MRN: [id number]   DOB - 24-Jul-1931  Admit Date - 02/27/2019  Outpatient Primary MD for the patient is Theodis Sato, Fransico Meadow., MD  Patient coming from: [facility]  Chief Complaint  Patient presents with  . Cough      HPI:    Colleen Garrison  is a 85 y.o. female, with hypothyroidism who presents as a transfer from [facility] with hypoxic respiratory failure secondary to COVID-19.  Patient presented to the ER with 4 days of progressive shortness of breath.  She also complained about generalized weakness, easy fatigability, cough rhinorrhea.  She denies any chest pain, palpitations, orthopnea or PND. No lower extremity edema.  Denied any sick contacts.  No fever, chills, night sweats.  He denies any history of COPD, asthma, HTN, DM.  In the ER she was found to have significantly low 90s which improved to the 80s with ambulation.  She lives at home alone, does all ADLs.    Review of systems:  Review of Systems:  Constitutional: See HPI HEENT: negative for earaches, epistaxis, or sore throat Respiratory: See HPI Cardiovascular: negative for chest pain, palpitations, or syncope GU: negative for dysuria, urinary frequency, urinary urgency, hematuria Gastrointestinal: negative for abdominal pain, constipation, diarrhea, nausea or vomiting Musculoskeletal: negative for arthralgias, back pain or myalgias Neurological: negative for dizziness, headaches or weakness Behavioral/Psych: negative for suicidal or homicidal ideation Skin:negative for rash Heme: negative for bruises Endo: negative for hair loss, weight gain/loss  With Past History of the following  :   Past Medical History:  Diagnosis Date  . Depression   . Hiatal hernia   . Memory loss   . Thyroid disease       History reviewed. No pertinent surgical history.   Social History:     Social History   Tobacco Use  . Smoking status: Former Research scientist (life sciences)  . Smokeless tobacco: Never Used  Substance Use Topics  . Alcohol use: No     Family History :    History reviewed. No pertinent family history.   Home Medications:   Prior to Admission medications   Medication Sig Start Date End Date Taking? Authorizing Provider  escitalopram (LEXAPRO) 10 MG tablet Take by mouth. 08/12/16  Yes [provider]  estradiol (  ESTRACE) 0.1 MG/GM vaginal cream Place vaginally. 03/17/17  Yes [provider]  ibuprofen (ADVIL) 800 MG tablet TAKE 1 TABLET BY MOUTH ONCE DAILY IF NEEDED FOR PAIN 09/05/17  Yes [provider]  levothyroxine (SYNTHROID) 100 MCG tablet Take 100 mcg by mouth daily before breakfast.   Yes [provider]  LORazepam (ATIVAN) 0.5 MG tablet Take by mouth. 07/04/18  Yes [provider]  traZODone (DESYREL) 50 MG tablet Take by mouth. 08/12/16 05/21/19 Yes [provider]  ascorbic acid (VITAMIN C) 100 MG tablet Take by mouth.    [provider]  b complex vitamins capsule Take by mouth.    [provider]  Cholecalciferol 25 MCG (1000 UT) tablet Take by mouth.    [provider]  levothyroxine (SYNTHROID, LEVOTHROID) 75 MCG tablet Take 75 mcg by mouth daily.    [provider]  Multiple Vitamin (MULTIVITAMIN) capsule Take 1 capsule by mouth daily.    [provider]  omega-3 fish oil (MAXEPA) 1000 MG CAPS capsule Take by mouth.    [provider]     Allergies:     Allergies  Allergen Reactions  . Codeine      Physical Exam:   Vitals  Blood pressure (!) 112/48, pulse 61, temperature 98.2 F (36.8 C), temperature source Oral, resp. rate 16, height 5\' 3"  (1.6 m), weight  83.9 kg, SpO2 91 %.  Physical Exam   Constitutional - resting comfortably, no acute distress Eyes - pupils equal round and reactive to light and accomodation, extra ocular movements intact Nose - no gross deformity or drainage Mouth - no oral lesions noted Throat - no swelling or erythema Neck - supple, no JVD   CV - (+)S1S2, no murmurs  Resp - CTA bilaterally, no wheezing or crackles,  GI - (+)BS, soft, non-tender, non-distended Extrem - no clubbing, cyanosis, or peripheral edema  Skin - no rashes or wounds Neuro - alert, aware, oriented to person/place/time  Psych - normal affect, no anxiety   Patient has Pressure Ulcer on Admission?: no   Data Review:    CBC Recent Labs  Lab 02/27/19 1616  WBC 8.2  HGB 13.3  HCT 40.2  PLT 272  MCV 96.4  MCH 31.9  MCHC 33.1  RDW 12.6  LYMPHSABS 1.4  MONOABS 1.0  EOSABS 0.0  BASOSABS 0.0   ------------------------------------------------------------------------------------------------------------------  Chemistries  Recent Labs  Lab 02/27/19 1616  NA 137  K 4.1  CL 101  CO2 28  GLUCOSE 112*  BUN 14  CREATININE 0.78  CALCIUM 8.9  AST 26  ALT 21  ALKPHOS 41  BILITOT 0.8   ------------------------------------------------------------------------------------------------------------------ estimated creatinine clearance is 52.8 mL/min (by C-G formula based on SCr of 0.78 mg/dL). ------------------------------------------------------------------------------------------------------------------ No results for input(s): TSH, T4TOTAL, T3FREE, THYROIDAB in the last 72 hours.  Invalid input(s): FREET3  Coagulation profile No results for input(s): INR, PROTIME in the last 168 hours. ------------------------------------------------------------------------------------------------------------------- Recent Labs    02/27/19 1615  DDIMER 2.05*    -------------------------------------------------------------------------------------------------------------------  Cardiac Enzymes No results for input(s): CKMB, TROPONINI, MYOGLOBIN in the last 168 hours.  Invalid input(s): CK ------------------------------------------------------------------------------------------------------------------    Component Value Date/Time   BNP 57.5 02/27/2019 1616     ---------------------------------------------------------------------------------------------------------------  Urinalysis No results found for: COLORURINE, APPEARANCEUR, LABSPEC, PHURINE, GLUCOSEU, HGBUR, BILIRUBINUR, KETONESUR, PROTEINUR, UROBILINOGEN, NITRITE, LEUKOCYTESUR  ----------------------------------------------------------------------------------------------------------------   Imaging Results:    DG Chest Portable 1 View  Result Date: 02/27/2019 CLINICAL DATA:  Shortness of breath with hypoxia and  congestion. Former smoker. EXAM: PORTABLE CHEST 1 VIEW COMPARISON:  Radiographs 09/04/2018 FINDINGS: 1624 hours. The heart size and mediastinal contours are stable. There is a large hiatal hernia. The heart size is grossly normal. There is aortic atherosclerosis. There is possible mildly increased atelectasis in the left lower lobe medially. The lungs are otherwise clear. There is no pleural effusion, pneumothorax or edema. The bones appear unremarkable. Telemetry leads overlie the chest. IMPRESSION: No definite acute findings. Possible mildly increased left lower lobe atelectasis adjacent to a large hiatal hernia. Overall appearance is similar to previous study of 6 months ago. Electronically Signed   By: Carey BullocksWilliam  Veazey M.D.   On: 02/27/2019 17:01    Assessment & Plan:    Principal Problem:   Acute hypoxemic respiratory failure due to severe acute respiratory syndrome coronavirus 2 (SARS-CoV-2) disease (HCC) Active Problems:   Depression   Hypothyroidism     Acute  hypoxemic respiratory failure due SARS-CoV-2 disease: Patient presented with 4 days of shortness of breath, SARS-CoV-2 positive, CXR with left lower lobe atelectasis.  Patient sats were dropping to the 80s ambulation.  Admit patient for supportive care with antitussives, antipyretics, analgesics and antiemetics. Date of Dx: 02/27/2019 Oxygen requirements: 2 LPM Antibiotics: N/A Diuretics: N/A Vitamin C and Zinc: Per protocol Remdesivir: Started on 12/23 Steroids: Started on 12/23 Actemra: Not given yet Convalescent Plasma: Not given yet    Depression: She denies any suicidal homicidal ideations.  Continue home medications.    Hypothyroidism: Clinically euthyroid.  Continue Synthroid.  TSH.  DVT Prophylaxis Lovenox   AM Labs Ordered, also please review Full Orders  Family Communication: Admission, patients condition and plan of care including tests being ordered have been discussed with the patient who indicate understanding and agree with the plan and Code Status.  Code Status Full  Likely DC to Home  Condition GUARDED    Admission status: Inpatient  Time spent in minutes : 55   Coletta Memosrinity Daesean Lazarz M.D on 02/28/2019 at 6:24 AM  To page go to [email or web address] - password [email or web address]

## 2019-02-28 NOTE — Evaluation (Signed)
Physical Therapy Evaluation Patient Details Name: Colleen Garrison MRN: [id number] DOB: 10/01/1931 Today's Date: 02/28/2019   History of Present Illness  85 y/o female from home presented to hospital with hypoxic respiratory failure sec to COVID 19. PMHx: depression, memory loss, thyroid disease.  Clinical Impression   Pt admitted with above diagnosis. PTA lives in retirement community where she was very independent, states that she had some type of caregiver role for community (assisting others). Pt currently with functional limitations due to the deficits listed below (see PT Problem List). This pm she is moving very well needing mod I with bed mob and SBA with transfers from bed/recliner and commode. Pt also able to ambulate in hall approx 229ft with no assistive device and SBA, with no LOBs, pt was on 2L/min via [place] and sats remained in 90s throughout session. Pt will benefit from skilled PT to increase their independence and safety with mobility to allow discharge to the venue listed below.       Follow Up Recommendations No PT follow up    Equipment Recommendations  None recommended by PT    Recommendations for Other Services OT consult     Precautions / Restrictions Precautions Precautions: Fall Restrictions Weight Bearing Restrictions: No      Mobility  Bed Mobility Overal bed mobility: Modified Independent                Transfers Overall transfer level: Needs assistance Equipment used: None Transfers: Sit to/from Stand;Stand Pivot Transfers Sit to Stand: Supervision Stand pivot transfers: Supervision       General transfer comment: line management and set up needed  Ambulation/Gait Ambulation/Gait assistance: Supervision Gait Distance (Feet): 200 Feet Assistive device: None Gait Pattern/deviations: Step-through pattern     General Gait Details: ambulated approx 238ft with no AD and SBA on 2L/min via [place] sats remained in 90s  Stairs             Wheelchair Mobility    Modified Rankin (Stroke Patients Only)       Balance Overall balance assessment: Mild deficits observed, not formally tested                                           Pertinent Vitals/Pain Pain Assessment: No/denies pain    Home Living Family/patient expects to be discharged to:: Private residence Living Arrangements: Alone Available Help at Discharge: Friend(s);Neighbor Type of Home: Apartment Home Access: Level entry     Home Layout: One level Home Equipment: Walker - 2 wheels      Prior Function Level of Independence: Independent               Hand Dominance        Extremity/Trunk Assessment   Upper Extremity Assessment Upper Extremity Assessment: Overall WFL for tasks assessed    Lower Extremity Assessment Lower Extremity Assessment: Overall WFL for tasks assessed       Communication   Communication: No difficulties  Cognition Arousal/Alertness: Awake/alert Behavior During Therapy: WFL for tasks assessed/performed Overall Cognitive Status: Within Functional Limits for tasks assessed                                        General Comments      Exercises Other Exercises Other Exercises: initiated use of  incentive spirometer, able to pull Other Exercises: initiated use of flutter valve x 10 reps   Assessment/Plan    PT Assessment Patient needs continued PT services  PT Problem List Decreased strength;Decreased activity tolerance       PT Treatment Interventions Gait training;Functional mobility training;Therapeutic activities;Therapeutic exercise;Balance training;Neuromuscular re-education;Patient/family education    PT Goals (Current goals can be found in the Care Plan section)  Acute Rehab PT Goals Patient Stated Goal: to go home as soon as done with meds PT Goal Formulation: With patient Time For Goal Achievement: 03/14/19 Potential to Achieve Goals: Good     Frequency Min 3X/week   Barriers to discharge   lives alone but in retirement community    Co-evaluation               AM-PAC PT "6 Clicks" Mobility  Outcome Measure Help needed turning from your back to your side while in a flat bed without using bedrails?: None Help needed moving from lying on your back to sitting on the side of a flat bed without using bedrails?: None Help needed moving to and from a bed to a chair (including a wheelchair)?: A Little Help needed standing up from a chair using your arms (e.g., wheelchair or bedside chair)?: A Little Help needed to walk in hospital room?: A Little Help needed climbing 3-5 steps with a railing? : A Lot 6 Click Score: 19    End of Session Equipment Utilized During Treatment: Oxygen Activity Tolerance: Patient tolerated treatment well Patient left: in bed;with call bell/phone within reach Nurse Communication: Mobility status;Other (comment)(pt c/o sore throat) PT Visit Diagnosis: Other abnormalities of gait and mobility (R26.89)    Time: 5361-4431 PT Time Calculation (min) (ACUTE ONLY): 25 min   Charges:   PT Evaluation $PT Eval Moderate Complexity: 1 Mod PT Treatments $Gait Training: 8-22 mins        Drema Pry, PT   Freddi Starr 02/28/2019, 4:16 PM

## 2019-03-01 LAB — COMPREHENSIVE METABOLIC PANEL
ALT: 26 U/L (ref 0–44)
AST: 37 U/L (ref 15–41)
Albumin: 3.7 g/dL (ref 3.5–5.0)
Alkaline Phosphatase: 34 U/L — ABNORMAL LOW (ref 38–126)
Anion gap: 8 (ref 5–15)
BUN: 23 mg/dL (ref 8–23)
CO2: 26 mmol/L (ref 22–32)
Calcium: 8.9 mg/dL (ref 8.9–10.3)
Chloride: 106 mmol/L (ref 98–111)
Creatinine, Ser: 0.7 mg/dL (ref 0.44–1.00)
GFR calc Af Amer: >60 mL/min (ref >60)
GFR calc non Af Amer: >60 mL/min (ref >60)
Glucose, Bld: 140 mg/dL — ABNORMAL HIGH (ref 70–99)
Potassium: 4.8 mmol/L (ref 3.5–5.1)
Sodium: 140 mmol/L (ref 135–145)
Total Bilirubin: 0.7 mg/dL (ref 0.3–1.2)
Total Protein: 6.6 g/dL (ref 6.5–8.1)

## 2019-03-01 LAB — FERRITIN: Ferritin: 85 ng/mL (ref 11–307)

## 2019-03-01 LAB — CBC WITH DIFFERENTIAL/PLATELET
Abs Immature Granulocytes: 0.02 10*3/uL (ref 0.00–0.07)
Basophils Absolute: 0 10*3/uL (ref 0.0–0.1)
Basophils Relative: 0 %
Eosinophils Absolute: 0 10*3/uL (ref 0.0–0.5)
Eosinophils Relative: 0 %
HCT: 38.4 % (ref 36.0–46.0)
Hemoglobin: 12.5 g/dL (ref 12.0–15.0)
Immature Granulocytes: 0 %
Lymphocytes Relative: 19 %
Lymphs Abs: 1 10*3/uL (ref 0.7–4.0)
MCH: 31.6 pg (ref 26.0–34.0)
MCHC: 32.6 g/dL (ref 30.0–36.0)
MCV: 97 fL (ref 80.0–100.0)
Monocytes Absolute: 0.4 10*3/uL (ref 0.1–1.0)
Monocytes Relative: 8 %
Neutro Abs: 3.7 10*3/uL (ref 1.7–7.7)
Neutrophils Relative %: 73 %
Platelets: 265 10*3/uL (ref 150–400)
RBC: 3.96 MIL/uL (ref 3.87–5.11)
RDW: 12.7 % (ref 11.5–15.5)
WBC: 5.1 10*3/uL (ref 4.0–10.5)
nRBC: 0 % (ref 0.0–0.2)

## 2019-03-01 LAB — GLUCOSE, CAPILLARY
Glucose-Capillary: 116 mg/dL — ABNORMAL HIGH (ref 70–99)
Glucose-Capillary: 149 mg/dL — ABNORMAL HIGH (ref 70–99)
Glucose-Capillary: 81 mg/dL (ref 70–99)
Glucose-Capillary: 144 mg/dL — ABNORMAL HIGH (ref 70–99)

## 2019-03-01 LAB — D-DIMER, QUANTITATIVE: D-Dimer, Quant: 1.78 ug/mL-FEU — ABNORMAL HIGH (ref 0.00–0.50)

## 2019-03-01 LAB — HEMOGLOBIN A1C
Hgb A1c MFr Bld: 5.5 % (ref 4.8–5.6)
Mean Plasma Glucose: 111.15 mg/dL

## 2019-03-01 LAB — MAGNESIUM: Magnesium: 2 mg/dL (ref 1.7–2.4)

## 2019-03-01 LAB — C-REACTIVE PROTEIN: CRP: 2.3 mg/dL — ABNORMAL HIGH (ref <1.0)

## 2019-03-01 MED ORDER — LIP MEDEX EX OINT
1.0000 "application " | TOPICAL_OINTMENT | CUTANEOUS | Status: DC | PRN
  Filled 2019-03-01: qty 7

## 2019-03-01 NOTE — Progress Notes (Signed)
Daughter Tammi Klippel called to give an update, no answer at this time.  Patient spoke with daughter throughout the shift.

## 2019-03-01 NOTE — Plan of Care (Signed)
  Problem: Education: Goal: Knowledge of risk factors and measures for prevention of condition will improve 03/01/2019 1832 by Cyndi Bender, RN Outcome: Progressing 03/01/2019 1832 by Cyndi Bender, RN Outcome: Progressing   Problem: Coping: Goal: Psychosocial and spiritual needs will be supported 03/01/2019 1832 by Cyndi Bender, RN Outcome: Progressing 03/01/2019 1832 by Cyndi Bender, RN Outcome: Progressing   Problem: Respiratory: Goal: Will maintain a patent airway 03/01/2019 1832 by Cyndi Bender, RN Outcome: Progressing 03/01/2019 1832 by Cyndi Bender, RN Outcome: Progressing Goal: Complications related to the disease process, condition or treatment will be avoided or minimized 03/01/2019 1832 by Cyndi Bender, RN Outcome: Progressing 03/01/2019 1832 by Cyndi Bender, RN Outcome: Progressing

## 2019-03-01 NOTE — Progress Notes (Signed)
PROGRESS NOTE                                                                                                                                                                                                             Patient Demographics:    Colleen Garrison, is a 85 y.o. female, DOB - [birth year], [id number]  Outpatient Primary MD for the patient is Theodis Sato, Fransico Meadow., MD   Admit date - 02/27/2019   LOS - 2  Chief Complaint  Patient presents with  . Cough       Brief Narrative: Patient is a 85 y.o. female with PMHx of hypothyroidism-presented with low-grade fever, cough and shortness of breath-she was found to have acute hypoxic respiratory failure secondary to COVID-19 pneumonia.  See below for further details.   Subjective:    Colleen Garrison feels better-she however remains on 2 L of oxygen.  Denies any chest pain.   Assessment  & Plan :   Acute Hypoxic Resp Failure due to Covid 19 Viral pneumonia: Stable-appears to have mild hypoxemia-appears now trending down.  Continue steroids and remdesivir.    Fever: afebrile  O2 requirements:  SpO2: 93 % O2 Flow Rate (L/min): 2 L/min   COVID-19 Labs: Recent Labs    02/27/19 1614 02/27/19 1615 02/27/19 1743 02/28/19 1023 03/01/19 0200 03/01/19 0220  DDIMER  --  2.05*  --  1.96*  --  1.78*  FERRITIN 85  --   --  86 85  --   LDH  --   --  197*  --   --   --   CRP 1.0*  --   --  3.5* 2.3*  --        Component Value Date/Time   BNP 57.5 02/27/2019 1616    Recent Labs  Lab 02/27/19 1615  PROCALCITON <0.10    No results found for: SARSCOV2NAA   COVID-19 Medications: Steroids: 12/23>> Remdesivir: 12/23>>  Other medications: Diuretics:Euvolemic-no need for lasix Antibiotics:Not needed as no evidence of bacterial infection Insulin: CBG stable on SSI while on steroids.  A1c 5.5  Prone/Incentive Spirometry: encouragedincentive spirometry  use 3-4/hour.  DVT Prophylaxis  :  Lovenox   Hypokalemia: Repleted-follow electrolytes periodically.  Hypothyroidism: Continue Synthroid  Depression: Appears stable-continue with Lexapro and trazodone.  Obesity: Estimated body mass index is 32.77 kg/m as calculated from  the following:   Height as of this encounter: 5' 3"  (1.6 m).   Weight as of this encounter: 83.9 kg.    Consults  :  None  Procedures  :  None  ABG: No results found for: PHART, PCO2ART, PO2ART, HCO3, TCO2, ACIDBASEDEF, O2SAT  Vent Settings: N/A  Condition - Stable  Family Communication  : Left a voicemail for daughter  Code Status :  Full Code  Diet :  Diet Order            Diet regular Room service appropriate? Yes; Fluid consistency: Thin  Diet effective now               Disposition Plan  :  Remain hospitalized  Barriers to discharge: Hypoxia requiring O2 supplementation/complete 5 days of IV Remdesivir  Antimicorbials  :    Anti-infectives (From admission, onward)   Start     Dose/Rate Route Frequency Ordered Stop   02/28/19 1600  remdesivir 100 mg in sodium chloride 0.9 % 100 mL IVPB     100 mg 200 mL/hr over 30 Minutes Intravenous Daily 02/27/19 2107 03/04/19 0959   02/27/19 2000  remdesivir 200 mg in sodium chloride 0.9% 250 mL IVPB     200 mg 580 mL/hr over 30 Minutes Intravenous Once 02/27/19 1843 02/27/19 2100      Inpatient Medications  Scheduled Meds: . vitamin C  500 mg Oral Daily  . dexamethasone  6 mg Oral Q24H  . docusate sodium  100 mg Oral Daily  . enoxaparin (LOVENOX) injection  40 mg Subcutaneous Q24H  . escitalopram  10 mg Oral Daily  . insulin aspart  0-9 Units Subcutaneous TID WC  . Ipratropium-Albuterol  1 puff Inhalation Q6H  . levothyroxine  100 mcg Oral QAC breakfast  . traZODone  50 mg Oral QHS  . zinc sulfate  220 mg Oral Daily   Continuous Infusions: . remdesivir 100 mg in NS 100 mL 100 mg (03/01/19 0840)   PRN Meds:.acetaminophen,  chlorpheniramine-HYDROcodone, guaiFENesin-dextromethorphan, lip balm, LORazepam   Time Spent in minutes  25 See all Orders from today for further details   Oren Binet M.D on 03/01/2019 at 2:13 PM  To page go to [email or web address] - use universal password  [facility] -  Office  [phone]    Objective:   Vitals:   02/28/19 2125 03/01/19 0000 03/01/19 0400 03/01/19 0724  BP:  106/71 (!) 142/66 (!) 149/81  Pulse:  67 (!) 50 (!) 56  Resp:  19 14 15   Temp: (!) 97.4 F (36.3 C) 97.7 F (36.5 C) (!) 97.4 F (36.3 C) 98.1 F (36.7 C)  TempSrc: Oral Oral Axillary Oral  SpO2:  90% 92% 93%  Weight:      Height:        Wt Readings from Last 3 Encounters:  02/28/19 83.9 kg  09/07/11 70.3 kg     Intake/Output Summary (Last 24 hours) at 03/01/2019 1413 Last data filed at 03/01/2019 0600 Gross per 24 hour  Intake 490 ml  Output --  Net 490 ml     Physical Exam Gen Exam:Alert awake-not in any distress HEENT:atraumatic, normocephalic Chest: B/L clear to auscultation anteriorly CVS:S1S2 regular Abdomen:soft non tender, non distended Extremities:no edema Neurology: Non focal Skin: no rash   Data Review:    CBC Recent Labs  Lab 02/27/19 1616 02/28/19 1023 03/01/19 0220  WBC 8.2 5.5 5.1  HGB 13.3 12.8 12.5  HCT 40.2 39.2 38.4  PLT 272 282 265  MCV 96.4 96.1 97.0  MCH 31.9 31.4 31.6  MCHC 33.1 32.7 32.6  RDW 12.6 12.6 12.7  LYMPHSABS 1.4 0.9 1.0  MONOABS 1.0 0.4 0.4  EOSABS 0.0 0.0 0.0  BASOSABS 0.0 0.0 0.0    Chemistries  Recent Labs  Lab 02/27/19 1616 02/28/19 1023 03/01/19 0220  NA 137 141 140  K 4.1 2.8* 4.8  CL 101 102 106  CO2 28 28 26   GLUCOSE 112* 160* 140*  BUN 14 19 23   CREATININE 0.78 0.73 0.70  CALCIUM 8.9 8.9 8.9  MG  --   --  2.0  AST 26 31 37  ALT 21 25 26   ALKPHOS 41 36* 34*  BILITOT 0.8 1.0 0.7    ------------------------------------------------------------------------------------------------------------------ Recent Labs    02/27/19 1615  TRIG 164*    Lab Results  Component Value Date   HGBA1C 5.5 03/01/2019   ------------------------------------------------------------------------------------------------------------------ No results for input(s): TSH, T4TOTAL, T3FREE, THYROIDAB in the last 72 hours.  Invalid input(s): FREET3 ------------------------------------------------------------------------------------------------------------------ Recent Labs    02/28/19 1023 03/01/19 0200  FERRITIN 86 85    Coagulation profile No results for input(s): INR, PROTIME in the last 168 hours.  Recent Labs    02/28/19 1023 03/01/19 0220  DDIMER 1.96* 1.78*    Cardiac Enzymes No results for input(s): CKMB, TROPONINI, MYOGLOBIN in the last 168 hours.  Invalid input(s): CK ------------------------------------------------------------------------------------------------------------------    Component Value Date/Time   BNP 57.5 02/27/2019 1616    Micro Results Recent Results (from the past 240 hour(s))  SARS Coronavirus 2 Ag (30 min TAT) - Nasal Swab (BD Veritor Kit)     Status: Abnormal   Collection Time: 02/27/19  4:16 PM   Specimen: Nasal Swab (BD Veritor Kit)  Result Value Ref Range Status   SARS Coronavirus 2 Ag POSITIVE (A) NEGATIVE Final    Comment: RESULT CALLED TO, READ BACK BY AND VERIFIED WITH: CALLED TO C.REED RN AT [phone] ON [id number] BY SROY (NOTE) SARS-CoV-2 antigen PRESENT. Positive results indicate the presence of viral antigens, but clinical correlation with patient history and other diagnostic information is necessary to determine patient infection status.  Positive results do not rule out bacterial infection or co-infection  with other viruses. False positive results are rare but can occur, and confirmatory RT-PCR testing may be appropriate in  some circumstances. The expected result is Negative. Fact Sheet for Patients: PodPark.tn Fact Sheet for Providers: GiftContent.is  This test is not yet approved or cleared by the [place] FDA and  has been authorized for detection and/or diagnosis of SARS-CoV-2 by FDA under an Emergency Use Authorization (EUA).  This EUA will remain in effect (meaning this test can be used) for the duration of  the COVID- 19 declaration under Section 564(b)(1) of the Act, 21 U.S.C. section 360bbb-3(b)(1), unless the authorization is terminated or revoked sooner. Performed at [facility], [address] [place], [place] [address]   Blood Culture (routine x 2)     Status: None (Preliminary result)   Collection Time: 02/27/19  5:43 PM   Specimen: BLOOD  Result Value Ref Range Status   Specimen Description   Final    BLOOD RIGHT ANTECUBITAL Performed at [facility], [address] [place], [place] [address]    Special Requests   Final    BOTTLES DRAWN AEROBIC AND ANAEROBIC Blood Culture adequate volume Performed at [facility], [address], [place], [place] [address]    Culture  Final    NO GROWTH 2 DAYS Performed at [facility], [place] [address], [place], [place] [address]    Report Status PENDING  Incomplete    Radiology Reports DG Chest Portable 1 View  Result Date: 02/27/2019 CLINICAL DATA:  Shortness of breath with hypoxia and congestion. Former smoker. EXAM: PORTABLE CHEST 1 VIEW COMPARISON:  Radiographs 09/04/2018 FINDINGS: 1624 hours. The heart size and mediastinal contours are stable. There is a large hiatal hernia. The heart size is grossly normal. There is aortic atherosclerosis. There is possible mildly increased atelectasis in the left lower lobe medially. The lungs are otherwise clear. There is no pleural effusion, pneumothorax or edema. The bones appear unremarkable. Telemetry  leads overlie the chest. IMPRESSION: No definite acute findings. Possible mildly increased left lower lobe atelectasis adjacent to a large hiatal hernia. Overall appearance is similar to previous study of 6 months ago. Electronically Signed   By: Richardean Sale M.D.   On: 02/27/2019 17:01

## 2019-03-01 NOTE — Plan of Care (Signed)
  Problem: Education: Goal: Knowledge of risk factors and measures for prevention of condition will improve Outcome: Progressing   Problem: Coping: Goal: Psychosocial and spiritual needs will be supported Outcome: Progressing   Problem: Respiratory: Goal: Will maintain a patent airway Outcome: Progressing Goal: Complications related to the disease process, condition or treatment will be avoided or minimized Outcome: Progressing   

## 2019-03-02 LAB — GLUCOSE, CAPILLARY
Glucose-Capillary: 108 mg/dL — ABNORMAL HIGH (ref 70–99)
Glucose-Capillary: 116 mg/dL — ABNORMAL HIGH (ref 70–99)
Glucose-Capillary: 112 mg/dL — ABNORMAL HIGH (ref 70–99)

## 2019-03-02 LAB — COMPREHENSIVE METABOLIC PANEL
ALT: 27 U/L (ref 0–44)
AST: 33 U/L (ref 15–41)
Albumin: 3.6 g/dL (ref 3.5–5.0)
Alkaline Phosphatase: 36 U/L — ABNORMAL LOW (ref 38–126)
Anion gap: 11 (ref 5–15)
BUN: 23 mg/dL (ref 8–23)
CO2: 28 mmol/L (ref 22–32)
Calcium: 9 mg/dL (ref 8.9–10.3)
Chloride: 102 mmol/L (ref 98–111)
Creatinine, Ser: 0.8 mg/dL (ref 0.44–1.00)
GFR calc Af Amer: >60 mL/min (ref >60)
GFR calc non Af Amer: >60 mL/min (ref >60)
Glucose, Bld: 148 mg/dL — ABNORMAL HIGH (ref 70–99)
Potassium: 4.5 mmol/L (ref 3.5–5.1)
Sodium: 141 mmol/L (ref 135–145)
Total Bilirubin: 0.2 mg/dL — ABNORMAL LOW (ref 0.3–1.2)
Total Protein: 6.7 g/dL (ref 6.5–8.1)

## 2019-03-02 LAB — CBC WITH DIFFERENTIAL/PLATELET
Abs Immature Granulocytes: 0.03 10*3/uL (ref 0.00–0.07)
Basophils Absolute: 0 10*3/uL (ref 0.0–0.1)
Basophils Relative: 0 %
Eosinophils Absolute: 0 10*3/uL (ref 0.0–0.5)
Eosinophils Relative: 0 %
HCT: 40.4 % (ref 36.0–46.0)
Hemoglobin: 12.9 g/dL (ref 12.0–15.0)
Immature Granulocytes: 1 %
Lymphocytes Relative: 26 %
Lymphs Abs: 1.4 10*3/uL (ref 0.7–4.0)
MCH: 31.2 pg (ref 26.0–34.0)
MCHC: 31.9 g/dL (ref 30.0–36.0)
MCV: 97.6 fL (ref 80.0–100.0)
Monocytes Absolute: 0.4 10*3/uL (ref 0.1–1.0)
Monocytes Relative: 8 %
Neutro Abs: 3.4 10*3/uL (ref 1.7–7.7)
Neutrophils Relative %: 65 %
Platelets: 270 10*3/uL (ref 150–400)
RBC: 4.14 MIL/uL (ref 3.87–5.11)
RDW: 12.8 % (ref 11.5–15.5)
WBC: 5.3 10*3/uL (ref 4.0–10.5)
nRBC: 0 % (ref 0.0–0.2)

## 2019-03-02 LAB — FERRITIN: Ferritin: 89 ng/mL (ref 11–307)

## 2019-03-02 LAB — C-REACTIVE PROTEIN: CRP: 1.2 mg/dL — ABNORMAL HIGH (ref <1.0)

## 2019-03-02 LAB — D-DIMER, QUANTITATIVE: D-Dimer, Quant: 1.83 ug/mL-FEU — ABNORMAL HIGH (ref 0.00–0.50)

## 2019-03-02 NOTE — Progress Notes (Signed)
Oxygen Requirements  Patient maintained oxygen saturation of 96% on room air while both sitting and ambulating in room.

## 2019-03-02 NOTE — Progress Notes (Addendum)
PROGRESS NOTE                                                                                                                                                                                                             Patient Demographics:    Colleen Garrison, is a 85 y.o. female, DOB - [birth year], NUU:[id number]  Outpatient Primary MD for the patient is Theodis Sato, Fransico Meadow., MD   Admit date - 02/27/2019   LOS - 3  Chief Complaint  Patient presents with  . Cough       Brief Narrative: Patient is a 85 y.o. female with PMHx of hypothyroidism-presented with low-grade fever, cough and shortness of breath-she was found to have acute hypoxic respiratory failure secondary to COVID-19 pneumonia.  See below for further details.   Subjective:    Mychelle Kendra continues to improve-she was titrated to room air earlier.   Assessment  & Plan :   Acute Hypoxic Resp Failure due to Covid 19 Viral pneumonia: Stable-titrated to room air-continue steroids/remdesivir.  Suspect that if clinical improvement continues-she could possibly be discharged on 12/27 after she completes a course of remdesivir.  Fever: afebrile  O2 requirements:  SpO2: 92 % O2 Flow Rate (L/min): 2 L/min   COVID-19 Labs: Recent Labs    02/27/19 1743 02/28/19 1023 03/01/19 0200 03/01/19 0220 03/02/19 0232  DDIMER  --  1.96*  --  1.78* 1.83*  FERRITIN  --  86 85  --  89  LDH 197*  --   --   --   --   CRP  --  3.5* 2.3*  --  1.2*       Component Value Date/Time   BNP 57.5 02/27/2019 1616    Recent Labs  Lab 02/27/19 1615  PROCALCITON <0.10    No results found for: SARSCOV2NAA   COVID-19 Medications: Steroids: 12/23>> Remdesivir: 12/23>>  Other medications: Diuretics:Euvolemic-no need for lasix Antibiotics:Not needed as no evidence of bacterial infection Insulin: CBG stable on SSI while on steroids.  A1c 5.5  Prone/Incentive  Spirometry: encouragedincentive spirometry use 3-4/hour.  DVT Prophylaxis  :  Lovenox   Hypokalemia: Repleted-follow electrolytes periodically.  Hypothyroidism: Continue Synthroid  Depression: Appears stable-continue with Lexapro and trazodone.  Obesity: Estimated body mass index is 32.77 kg/m as calculated from the following:   Height as of this encounter:  _0  (1.6 m).   Weight as of this encounter: 83.9 kg.    Consults  :  None  Procedures  :  None  ABG: No results found for: PHART, PCO2ART, PO2ART, HCO3, TCO2, ACIDBASEDEF, O2SAT  Vent Settings: N/A  Condition - Stable  Family Communication  : Daughter updated over the phone on 12/26  Code Status :  Full Code  Diet :  Diet Order            Diet regular Room service appropriate? Yes; Fluid consistency: Thin  Diet effective now               Disposition Plan  :  Remain hospitalized  Barriers to discharge: Hypoxia requiring O2 supplementation/complete 5 days of IV Remdesivir  Antimicorbials  :    Anti-infectives (From admission, onward)   Start     Dose/Rate Route Frequency Ordered Stop   02/28/19 1600  remdesivir 100 mg in sodium chloride 0.9 % 100 mL IVPB     100 mg 200 mL/hr over 30 Minutes Intravenous Daily 02/27/19 2107 03/04/19 0959   02/27/19 2000  remdesivir 200 mg in sodium chloride 0.9% 250 mL IVPB     200 mg 580 mL/hr over 30 Minutes Intravenous Once 02/27/19 1843 02/27/19 2100      Inpatient Medications  Scheduled Meds: . vitamin C  500 mg Oral Daily  . dexamethasone  6 mg Oral Q24H  . docusate sodium  100 mg Oral Daily  . enoxaparin (LOVENOX) injection  40 mg Subcutaneous Q24H  . escitalopram  10 mg Oral Daily  . insulin aspart  0-9 Units Subcutaneous TID WC  . Ipratropium-Albuterol  1 puff Inhalation Q6H  . levothyroxine  100 mcg Oral QAC breakfast  . traZODone  50 mg Oral QHS  . zinc sulfate  220 mg Oral Daily   Continuous Infusions: . remdesivir 100 mg in NS 100 mL 100 mg  (03/02/19 1017)   PRN Meds:.acetaminophen, chlorpheniramine-HYDROcodone, guaiFENesin-dextromethorphan, lip balm, LORazepam   Time Spent in minutes  25 See all Orders from today for further details   Oren Binet M.D on 03/02/2019 at 4:30 PM  To page go to [email or web address] - use universal password  [facility] -  Office  [phone]    Objective:   Vitals:   03/01/19 0724 03/01/19 1615 03/01/19 2000 03/02/19 0420  BP: (!) 149/81 (!) 169/58 (!) 152/57 (!) 140/54  Pulse: (!) 56 61 64 (!) 55  Resp: 15 16 (!) 25 (!) 23  Temp: 98.1 F (36.7 C) 97.6 F (36.4 C) (!) 97.4 F (36.3 C) 97.9 F (36.6 C)  TempSrc: Oral Oral Oral Oral  SpO2: 93% 98% 96% 92%  Weight:      Height:        Wt Readings from Last 3 Encounters:  02/28/19 83.9 kg  09/07/11 70.3 kg    No intake or output data in the 24 hours ending 03/02/19 1630   Physical Exam Gen Exam:Alert awake-not in any distress HEENT:atraumatic, normocephalic Chest: B/L clear to auscultation anteriorly CVS:S1S2 regular Abdomen:soft non tender, non distended Extremities:no edema Neurology: Non focal Skin: no rash   Data Review:    CBC Recent Labs  Lab 02/27/19 1616 02/28/19 1023 03/01/19 0220 03/02/19 0232  WBC 8.2 5.5 5.1 5.3  HGB 13.3 12.8 12.5 12.9  HCT 40.2 39.2 38.4 40.4  PLT 272 282 265 270  MCV 96.4 96.1 97.0 97.6  MCH 31.9 31.4 31.6 31.2  MCHC 33.1 32.7 32.6 31.9  RDW 12.6 12.6 12.7 12.8  LYMPHSABS 1.4 0.9 1.0 1.4  MONOABS 1.0 0.4 0.4 0.4  EOSABS 0.0 0.0 0.0 0.0  BASOSABS 0.0 0.0 0.0 0.0    Chemistries  Recent Labs  Lab 02/27/19 1616 02/28/19 1023 03/01/19 0220 03/02/19 0232  NA 137 141 140 141  K 4.1 2.8* 4.8 4.5  CL 101 102 106 102  CO2 _0 GLUCOSE 112* 160* 140* 148*  BUN _1 CREATININE 0.78 0.73 0.70 0.80  CALCIUM 8.9 8.9 8.9 9.0  MG  --   --  2.0  --   AST 26 31 37 33  ALT _2 ALKPHOS 41 36* 34* 36*  BILITOT 0.8 1.0 0.7 0.2*    ------------------------------------------------------------------------------------------------------------------ No results for input(s): CHOL, HDL, LDLCALC, TRIG, CHOLHDL, LDLDIRECT in the last 72 hours.  Lab Results  Component Value Date   HGBA1C 5.5 03/01/2019   ------------------------------------------------------------------------------------------------------------------ No results for input(s): TSH, T4TOTAL, T3FREE, THYROIDAB in the last 72 hours.  Invalid input(s): FREET3 ------------------------------------------------------------------------------------------------------------------ Recent Labs    03/01/19 0200 03/02/19 0232  FERRITIN 85 89    Coagulation profile No results for input(s): INR, PROTIME in the last 168 hours.  Recent Labs    03/01/19 0220 03/02/19 0232  DDIMER 1.78* 1.83*    Cardiac Enzymes No results for input(s): CKMB, TROPONINI, MYOGLOBIN in the last 168 hours.  Invalid input(s): CK ------------------------------------------------------------------------------------------------------------------    Component Value Date/Time   BNP 57.5 02/27/2019 1616    Micro Results Recent Results (from the past 240 hour(s))  SARS Coronavirus 2 Ag (30 min TAT) - Nasal Swab (BD Veritor Kit)     Status: Abnormal   Collection Time: 02/27/19  4:16 PM   Specimen: Nasal Swab (BD Veritor Kit)  Result Value Ref Range Status   SARS Coronavirus 2 Ag POSITIVE (A) NEGATIVE Final    Comment: RESULT CALLED TO, READ BACK BY AND VERIFIED WITH: CALLED TO C.REED RN AT [phone] ON [id number] BY SROY (NOTE) SARS-CoV-2 antigen PRESENT. Positive results indicate the presence of viral antigens, but clinical correlation with patient history and other diagnostic information is necessary to determine patient infection status.  Positive results do not rule out bacterial infection or co-infection  with other viruses. False positive results are rare but can occur, and confirmatory  RT-PCR testing may be appropriate in some circumstances. The expected result is Negative. Fact Sheet for Patients: PodPark.tn Fact Sheet for Providers: GiftContent.is  This test is not yet approved or cleared by the [place] FDA and  has been authorized for detection and/or diagnosis of SARS-CoV-2 by FDA under an Emergency Use Authorization (EUA).  This EUA will remain in effect (meaning this test can be used) for the duration of  the COVID- 19 declaration under Section 564(b)(1) of the Act, 21 U.S.C. section 360bbb-3(b)(1), unless the authorization is terminated or revoked sooner. Performed at [facility], [address] [place], [place] [address]   Blood Culture (routine x 2)     Status: None (Preliminary result)   Collection Time: 02/27/19  5:43 PM   Specimen: BLOOD  Result Value Ref Range Status   Specimen Description   Final    BLOOD RIGHT ANTECUBITAL Performed at [facility], [address] [place], [place] [address]    Special Requests   Final    BOTTLES DRAWN AEROBIC AND ANAEROBIC Blood Culture adequate volume Performed at [facility], [address] [place], [place] [address]    Culture   Final    NO GROWTH 3 DAYS Performed at [facility], [place] [address] [place], [place] [address]    Report Status PENDING  Incomplete    Radiology Reports DG Chest Portable 1 View  Result Date: 02/27/2019 CLINICAL DATA:  Shortness of breath with hypoxia and congestion. Former smoker. EXAM: PORTABLE CHEST 1 VIEW COMPARISON:  Radiographs 09/04/2018 FINDINGS: 1624 hours. The heart size and mediastinal contours are stable. There is a large hiatal hernia. The heart size is grossly normal. There is aortic atherosclerosis. There is possible mildly increased atelectasis in the left lower lobe medially. The lungs are otherwise clear. There is no pleural effusion, pneumothorax or edema. The  bones appear unremarkable. Telemetry leads overlie the chest. IMPRESSION: No definite acute findings. Possible mildly increased left lower lobe atelectasis adjacent to a large hiatal hernia. Overall appearance is similar to previous study of 6 months ago. Electronically Signed   By: Richardean Sale M.D.   On: 02/27/2019 17:01

## 2019-03-02 NOTE — Plan of Care (Signed)
  Problem: Education: Goal: Knowledge of risk factors and measures for prevention of condition will improve Outcome: Progressing   Problem: Coping: Goal: Psychosocial and spiritual needs will be supported Outcome: Progressing   Problem: Respiratory: Goal: Will maintain a patent airway Outcome: Progressing Goal: Complications related to the disease process, condition or treatment will be avoided or minimized Outcome: Progressing   

## 2019-03-03 DIAGNOSIS — R0902 Hypoxemia: Secondary | ICD-10-CM

## 2019-03-03 LAB — CBC WITH DIFFERENTIAL/PLATELET
Abs Immature Granulocytes: 0.04 10*3/uL (ref 0.00–0.07)
Basophils Absolute: 0 10*3/uL (ref 0.0–0.1)
Basophils Relative: 0 %
Eosinophils Absolute: 0 10*3/uL (ref 0.0–0.5)
Eosinophils Relative: 0 %
HCT: 39.6 % (ref 36.0–46.0)
Hemoglobin: 12.8 g/dL (ref 12.0–15.0)
Immature Granulocytes: 1 %
Lymphocytes Relative: 16 %
Lymphs Abs: 1 10*3/uL (ref 0.7–4.0)
MCH: 31.7 pg (ref 26.0–34.0)
MCHC: 32.3 g/dL (ref 30.0–36.0)
MCV: 98 fL (ref 80.0–100.0)
Monocytes Absolute: 0.3 10*3/uL (ref 0.1–1.0)
Monocytes Relative: 4 %
Neutro Abs: 5.1 10*3/uL (ref 1.7–7.7)
Neutrophils Relative %: 79 %
Platelets: 271 10*3/uL (ref 150–400)
RBC: 4.04 MIL/uL (ref 3.87–5.11)
RDW: 12.7 % (ref 11.5–15.5)
WBC: 6.4 10*3/uL (ref 4.0–10.5)
nRBC: 0 % (ref 0.0–0.2)

## 2019-03-03 LAB — GLUCOSE, CAPILLARY
Glucose-Capillary: 98 mg/dL (ref 70–99)
Glucose-Capillary: 74 mg/dL (ref 70–99)

## 2019-03-03 LAB — COMPREHENSIVE METABOLIC PANEL
ALT: 31 U/L (ref 0–44)
AST: 29 U/L (ref 15–41)
Albumin: 3.7 g/dL (ref 3.5–5.0)
Alkaline Phosphatase: 33 U/L — ABNORMAL LOW (ref 38–126)
Anion gap: 18 — ABNORMAL HIGH (ref 5–15)
BUN: 22 mg/dL (ref 8–23)
CO2: 25 mmol/L (ref 22–32)
Calcium: 8.6 mg/dL — ABNORMAL LOW (ref 8.9–10.3)
Chloride: 97 mmol/L — ABNORMAL LOW (ref 98–111)
Creatinine, Ser: 0.73 mg/dL (ref 0.44–1.00)
GFR calc Af Amer: >60 mL/min (ref >60)
GFR calc non Af Amer: >60 mL/min (ref >60)
Glucose, Bld: 159 mg/dL — ABNORMAL HIGH (ref 70–99)
Potassium: 4.6 mmol/L (ref 3.5–5.1)
Sodium: 140 mmol/L (ref 135–145)
Total Bilirubin: 0.4 mg/dL (ref 0.3–1.2)
Total Protein: 7 g/dL (ref 6.5–8.1)

## 2019-03-03 LAB — D-DIMER, QUANTITATIVE: D-Dimer, Quant: 1.2 ug/mL-FEU — ABNORMAL HIGH (ref 0.00–0.50)

## 2019-03-03 LAB — FERRITIN: Ferritin: 87 ng/mL (ref 11–307)

## 2019-03-03 LAB — C-REACTIVE PROTEIN: CRP: 0.9 mg/dL (ref <1.0)

## 2019-03-03 MED ORDER — DEXAMETHASONE 4 MG PO TABS: 4.0000 mg | ORAL_TABLET | Freq: Every day | ORAL | 0 refills | Status: DC

## 2019-03-03 NOTE — Discharge Summary (Signed)
PATIENT DETAILS Name: Colleen Garrison Age: 85 y.o. Sex: female Date of Birth: 1931/07/10 MRN: [id number]. Admitting Physician: Thurnell Lose, MD [id number], Fransico Meadow., MD  Admit Date: 02/27/2019 Discharge date: 03/03/2019  Recommendations for Outpatient Follow-up:  1. Follow up with PCP in 1-2 weeks 2. Please obtain CMP/CBC in one week 3. Repeat Chest Xray in 4-6 week  Admitted From:  Home  Disposition: Progress: No  Equipment/Devices: None  Discharge Condition: Stable  CODE STATUS: FULL CODE  Diet recommendation:  Diet Order            Diet - low sodium heart healthy        Diet regular Room service appropriate? Yes; Fluid consistency: Thin  Diet effective now               Brief Summary: See H&P, Labs, Consult and Test reports for all details in brief, Patient is a 85 y.o. female with PMHx of hypothyroidism-presented with low-grade fever, cough and shortness of breath-she was found to have acute hypoxic respiratory failure secondary to COVID-19 pneumonia.  See below for further details  Brief Hospital Course: Acute Hypoxic Resp Failure due to Covid 19 Viral pneumonia:  Much improved after treatment with steroids and remdesivir.  Will complete remdesivir on 12/27-following which patient can be discharged home.  She has been ambulating in the hallway by both PT and nursing staff-at this time she does not qualify for home O2 has a saturations were stable in the 90s on room air.   COVID-19 Labs:  Recent Labs    03/01/19 0200 03/01/19 0220 03/02/19 0232 03/03/19 0042  DDIMER  --  1.78* 1.83* 1.20*  FERRITIN 85  --  89 87  CRP 2.3*  --  1.2* 0.9    No results found for: SARSCOV2NAA   COVID-19 Medications: Steroids: 12/23>> Remdesivir: 12/23>>12/27   Hypokalemia: Repleted  Hypothyroidism: Continue Synthroid  Dementia: Appears to be mild-resume home medications  Depression: Appears stable-continue with Lexapro and  trazodone.  Obesity: Estimated body mass index is 32.77 kg/m as calculated from the following:   Height as of this encounter: '5\' 3"'$  (1.6 m).   Weight as of this encounter: 83.9 kg.    Procedures/Studies: None  Discharge Diagnoses:  Principal Problem:   Acute hypoxemic respiratory failure due to severe acute respiratory syndrome coronavirus 2 (SARS-CoV-2) disease (HCC) Active Problems:   Depression   Hypothyroidism   Discharge Instructions:    Person Under Monitoring Name: Colleen Garrison  Location: [address] [place] [address] [place] [address]   Infection Prevention Recommendations for Individuals Confirmed to have, or Being Evaluated for, 2019 Novel Coronavirus (COVID-19) Infection Who Receive Care at Home  Individuals who are confirmed to have, or are being evaluated for, COVID-19 should follow the prevention steps below until a healthcare provider or local or state health department says they can return to normal activities.  Stay home except to get medical care You should restrict activities outside your home, except for getting medical care. Do not go to work, school, or public areas, and do not use public transportation or taxis.  Call ahead before visiting your doctor Before your medical appointment, call the healthcare provider and tell them that you have, or are being evaluated for, COVID-19 infection. This will help the healthcare provider's office take steps to keep other people from getting infected. Ask your healthcare provider to call the local or state health department.  Monitor your symptoms Seek prompt  medical attention if your illness is worsening (e.g., difficulty breathing). Before going to your medical appointment, call the healthcare provider and tell them that you have, or are being evaluated for, COVID-19 infection. Ask your healthcare provider to call the local or state health department.  Wear a facemask You should wear a facemask  that covers your nose and mouth when you are in the same room with other people and when you visit a healthcare provider. People who live with or visit you should also wear a facemask while they are in the same room with you.  Separate yourself from other people in your home As much as possible, you should stay in a different room from other people in your home. Also, you should use a separate bathroom, if available.  Avoid sharing household items You should not share dishes, drinking glasses, cups, eating utensils, towels, bedding, or other items with other people in your home. After using these items, you should wash them thoroughly with soap and water.  Cover your coughs and sneezes Cover your mouth and nose with a tissue when you cough or sneeze, or you can cough or sneeze into your sleeve. Throw used tissues in a lined trash can, and immediately wash your hands with soap and water for at least 20 seconds or use an alcohol-based hand rub.  Wash your [facility] your hands often and thoroughly with soap and water for at least 20 seconds. You can use an alcohol-based hand sanitizer if soap and water are not available and if your hands are not visibly dirty. Avoid touching your eyes, nose, and mouth with unwashed hands.   Prevention Steps for Caregivers and Household Members of Individuals Confirmed to have, or Being Evaluated for, COVID-19 Infection Being Cared for in the Home  If you live with, or provide care at home for, a person confirmed to have, or being evaluated for, COVID-19 infection please follow these guidelines to prevent infection:  Follow healthcare provider's instructions Make sure that you understand and can help the patient follow any healthcare provider instructions for all care.  Provide for the patient's basic needs You should help the patient with basic needs in the home and provide support for getting groceries, prescriptions, and other personal  needs.  Monitor the patient's symptoms If they are getting sicker, call his or her medical provider and tell them that the patient has, or is being evaluated for, COVID-19 infection. This will help the healthcare provider's office take steps to keep other people from getting infected. Ask the healthcare provider to call the local or state health department.  Limit the number of people who have contact with the patient  If possible, have only one caregiver for the patient.  Other household members should stay in another home or place of residence. If this is not possible, they should stay  in another room, or be separated from the patient as much as possible. Use a separate bathroom, if available.  Restrict visitors who do not have an essential need to be in the home.  Keep older adults, very young children, and other sick people away from the patient Keep older adults, very young children, and those who have compromised immune systems or chronic health conditions away from the patient. This includes people with chronic heart, lung, or kidney conditions, diabetes, and cancer.  Ensure good ventilation Make sure that shared spaces in the home have good air flow, such as from an air conditioner or an opened window, weather  permitting.  Wash your hands often  Wash your hands often and thoroughly with soap and water for at least 20 seconds. You can use an alcohol based hand sanitizer if soap and water are not available and if your hands are not visibly dirty.  Avoid touching your eyes, nose, and mouth with unwashed hands.  Use disposable paper towels to dry your hands. If not available, use dedicated cloth towels and replace them when they become wet.  Wear a facemask and gloves  Wear a disposable facemask at all times in the room and gloves when you touch or have contact with the patient's blood, body fluids, and/or secretions or excretions, such as sweat, saliva, sputum, nasal mucus,  vomit, urine, or feces.  Ensure the mask fits over your nose and mouth tightly, and do not touch it during use.  Throw out disposable facemasks and gloves after using them. Do not reuse.  Wash your hands immediately after removing your facemask and gloves.  If your personal clothing becomes contaminated, carefully remove clothing and launder. Wash your hands after handling contaminated clothing.  Place all used disposable facemasks, gloves, and other waste in a lined container before disposing them with other household waste.  Remove gloves and wash your hands immediately after handling these items.  Do not share dishes, glasses, or other household items with the patient  Avoid sharing household items. You should not share dishes, drinking glasses, cups, eating utensils, towels, bedding, or other items with a patient who is confirmed to have, or being evaluated for, COVID-19 infection.  After the person uses these items, you should wash them thoroughly with soap and water.  Wash laundry thoroughly  Immediately remove and wash clothes or bedding that have blood, body fluids, and/or secretions or excretions, such as sweat, saliva, sputum, nasal mucus, vomit, urine, or feces, on them.  Wear gloves when handling laundry from the patient.  Read and follow directions on labels of laundry or clothing items and detergent. In general, wash and dry with the warmest temperatures recommended on the label.  Clean all areas the individual has used often  Clean all touchable surfaces, such as counters, tabletops, doorknobs, bathroom fixtures, toilets, phones, keyboards, tablets, and bedside tables, every day. Also, clean any surfaces that may have blood, body fluids, and/or secretions or excretions on them.  Wear gloves when cleaning surfaces the patient has come in contact with.  Use a diluted bleach solution (e.g., dilute bleach with 1 part bleach and 10 parts water) or a household disinfectant  with a label that says EPA-registered for coronaviruses. To make a bleach solution at home, add 1 tablespoon of bleach to 1 quart (4 cups) of water. For a larger supply, add  cup of bleach to 1 gallon (16 cups) of water.  Read labels of cleaning products and follow recommendations provided on product labels. Labels contain instructions for safe and effective use of the cleaning product including precautions you should take when applying the product, such as wearing gloves or eye protection and making sure you have good ventilation during use of the product.  Remove gloves and wash hands immediately after cleaning.  Monitor yourself for signs and symptoms of illness Caregivers and household members are considered close contacts, should monitor their health, and will be asked to limit movement outside of the home to the extent possible. Follow the monitoring steps for close contacts listed on the symptom monitoring form.   ? If you have additional questions, contact your local health  department or call the epidemiologist on call at [phone] (available 24/7). ? This guidance is subject to change. For the most up-to-date guidance from CDC, please refer to their website: YouBlogs.pl    Activity:  As tolerated   Discharge Instructions    Call MD for:  difficulty breathing, headache or visual disturbances   Complete by: As directed    Call MD for:  extreme fatigue   Complete by: As directed    Call MD for:  persistant dizziness or light-headedness   Complete by: As directed    Call MD for:  persistant nausea and vomiting   Complete by: As directed    Diet - low sodium heart healthy   Complete by: As directed    Discharge instructions   Complete by: As directed    Follow with Primary MD  Loraine Leriche., MD in 1-2 weeks  Please get a complete blood count and chemistry panel checked by your Primary MD at your next  visit, and again as instructed by your Primary MD.  Get Medicines reviewed and adjusted: Please take all your medications with you for your next visit with your Primary MD  Laboratory/radiological data: Please request your Primary MD to go over all hospital tests and procedure/radiological results at the follow up, please ask your Primary MD to get all Hospital records sent to his/her office.  In some cases, they will be blood work, cultures and biopsy results pending at the time of your discharge. Please request that your primary care M.D. follows up on these results.  Also Note the following: If you experience worsening of your admission symptoms, develop shortness of breath, life threatening emergency, suicidal or homicidal thoughts you must seek medical attention immediately by calling 911 or calling your MD immediately  if symptoms less severe.  You must read complete instructions/literature along with all the possible adverse reactions/side effects for all the Medicines you take and that have been prescribed to you. Take any new Medicines after you have completely understood and accpet all the possible adverse reactions/side effects.   Do not drive when taking Pain medications or sleeping medications (Benzodaizepines)  Do not take more than prescribed Pain, Sleep and Anxiety Medications. It is not advisable to combine anxiety,sleep and pain medications without talking with your primary care practitioner  Special Instructions: If you have smoked or chewed Tobacco  in the last 2 yrs please stop smoking, stop any regular Alcohol  and or any Recreational drug use.  Wear Seat belts while driving.  Please note: You were cared for by a hospitalist during your hospital stay. Once you are discharged, your primary care physician will handle any further medical issues. Please note that NO REFILLS for any discharge medications will be authorized once you are discharged, as it is imperative that you  return to your primary care physician (or establish a relationship with a primary care physician if you do not have one) for your post hospital discharge needs so that they can reassess your need for medications and monitor your lab values.   3 weeks of isolation from 02/27/2019   Increase activity slowly   Complete by: As directed      Allergies as of 03/03/2019      Reactions   Codeine Hives, Nausea And Vomiting      Medication List    TAKE these medications   albuterol 108 (90 Base) MCG/ACT inhaler Commonly known as: VENTOLIN HFA Inhale 2 puffs into the lungs every 6 (six)  hours as needed for wheezing.   ascorbic acid 100 MG tablet Commonly known as: VITAMIN C Take 500 mg by mouth daily.   b complex vitamins capsule Take by mouth.   Cholecalciferol 25 MCG (1000 UT) tablet Take 1,000 Units by mouth daily.   dexamethasone 4 MG tablet Commonly known as: DECADRON Take 1 tablet (4 mg total) by mouth daily.   donepezil 5 MG tablet Commonly known as: ARICEPT Take 5 mg by mouth at bedtime.   escitalopram 10 MG tablet Commonly known as: LEXAPRO Take 10 mg by mouth daily.   estradiol 0.1 MG/GM vaginal cream Commonly known as: ESTRACE Place 1 Applicatorful vaginally at bedtime as needed (itching).   ibuprofen 800 MG tablet Commonly known as: ADVIL Take 800 mg by mouth daily as needed for moderate pain.   levothyroxine 100 MCG tablet Commonly known as: SYNTHROID Take 100 mcg by mouth daily before breakfast.   levothyroxine 75 MCG tablet Commonly known as: SYNTHROID Take 75 mcg by mouth daily.   LORazepam 0.5 MG tablet Commonly known as: ATIVAN Take 0.5 mg by mouth at bedtime as needed for sleep.   multivitamin capsule Take 1 capsule by mouth daily.   omega-3 fish oil 1000 MG Caps capsule Commonly known as: MAXEPA Take 1 capsule by mouth daily.   promethazine-dextromethorphan 6.25-15 MG/5ML syrup Commonly known as: PROMETHAZINE-DM Take 5 mLs by mouth every 6  (six) hours as needed for cough.   traZODone 50 MG tablet Commonly known as: DESYREL Take 50 mg by mouth daily.   vitamin E 100 UNIT capsule Take 100 Units by mouth daily.       Allergies  Allergen Reactions  . Codeine Hives and Nausea And Vomiting    Consultations:   None  Other Procedures/Studies: DG Chest Portable 1 View  Result Date: 02/27/2019 CLINICAL DATA:  Shortness of breath with hypoxia and congestion. Former smoker. EXAM: PORTABLE CHEST 1 VIEW COMPARISON:  Radiographs 09/04/2018 FINDINGS: 1624 hours. The heart size and mediastinal contours are stable. There is a large hiatal hernia. The heart size is grossly normal. There is aortic atherosclerosis. There is possible mildly increased atelectasis in the left lower lobe medially. The lungs are otherwise clear. There is no pleural effusion, pneumothorax or edema. The bones appear unremarkable. Telemetry leads overlie the chest. IMPRESSION: No definite acute findings. Possible mildly increased left lower lobe atelectasis adjacent to a large hiatal hernia. Overall appearance is similar to previous study of 6 months ago. Electronically Signed   By: Richardean Sale M.D.   On: 02/27/2019 17:01     TODAY-DAY OF DISCHARGE:  Subjective:   Colleen Garrison today has no headache,no chest abdominal pain,no new weakness tingling or numbness, feels much better wants to go home today.   Objective:   Blood pressure (!) 149/79, pulse 63, temperature 97.9 F (36.6 C), temperature source Oral, resp. rate 16, height '5\' 3"'$  (1.6 m), weight 83.9 kg, SpO2 96 %.  Intake/Output Summary (Last 24 hours) at 03/03/2019 0836 Last data filed at 03/02/2019 2302 Gross per 24 hour  Intake 240 ml  Output --  Net 240 ml   Filed Weights   02/28/19 0028  Weight: 83.9 kg    Exam: Awake Alert, Oriented *3, No new F.N deficits, Normal affect [place].AT,PERRAL Supple Neck,No JVD, No cervical lymphadenopathy appriciated.  Symmetrical Chest wall  movement, Good air movement bilaterally, CTAB RRR,No Gallops,Rubs or new Murmurs, No Parasternal Heave +ve B.Sounds, Abd Soft, Non tender, No organomegaly appriciated, No rebound -guarding or  rigidity. No Cyanosis, Clubbing or edema, No new Rash or bruise   PERTINENT RADIOLOGIC STUDIES: DG Chest Portable 1 View  Result Date: 02/27/2019 CLINICAL DATA:  Shortness of breath with hypoxia and congestion. Former smoker. EXAM: PORTABLE CHEST 1 VIEW COMPARISON:  Radiographs 09/04/2018 FINDINGS: 1624 hours. The heart size and mediastinal contours are stable. There is a large hiatal hernia. The heart size is grossly normal. There is aortic atherosclerosis. There is possible mildly increased atelectasis in the left lower lobe medially. The lungs are otherwise clear. There is no pleural effusion, pneumothorax or edema. The bones appear unremarkable. Telemetry leads overlie the chest. IMPRESSION: No definite acute findings. Possible mildly increased left lower lobe atelectasis adjacent to a large hiatal hernia. Overall appearance is similar to previous study of 6 months ago. Electronically Signed   By: Richardean Sale M.D.   On: 02/27/2019 17:01     PERTINENT LAB RESULTS: CBC: Recent Labs    03/02/19 0232 03/03/19 0042  WBC 5.3 6.4  HGB 12.9 12.8  HCT 40.4 39.6  PLT 270 271   CMET CMP     Component Value Date/Time   NA 140 03/03/2019 0042   K 4.6 03/03/2019 0042   CL 97 (L) 03/03/2019 0042   CO2 25 03/03/2019 0042   GLUCOSE 159 (H) 03/03/2019 0042   BUN 22 03/03/2019 0042   CREATININE 0.73 03/03/2019 0042   CALCIUM 8.6 (L) 03/03/2019 0042   PROT 7.0 03/03/2019 0042   ALBUMIN 3.7 03/03/2019 0042   AST 29 03/03/2019 0042   ALT 31 03/03/2019 0042   ALKPHOS 33 (L) 03/03/2019 0042   BILITOT 0.4 03/03/2019 0042   GFRNONAA >60 03/03/2019 0042   GFRAA >60 03/03/2019 0042    GFR Estimated Creatinine Clearance: 52.8 mL/min (by C-G formula based on SCr of 0.73 mg/dL). No results for  input(s): LIPASE, AMYLASE in the last 72 hours. No results for input(s): CKTOTAL, CKMB, CKMBINDEX, TROPONINI in the last 72 hours. Invalid input(s): POCBNP Recent Labs    03/02/19 0232 03/03/19 0042  DDIMER 1.83* 1.20*   Recent Labs    03/01/19 0220  HGBA1C 5.5   No results for input(s): CHOL, HDL, LDLCALC, TRIG, CHOLHDL, LDLDIRECT in the last 72 hours. No results for input(s): TSH, T4TOTAL, T3FREE, THYROIDAB in the last 72 hours.  Invalid input(s): FREET3 Recent Labs    03/02/19 0232 03/03/19 0042  FERRITIN 89 87   Coags: No results for input(s): INR in the last 72 hours.  Invalid input(s): PT Microbiology: Recent Results (from the past 240 hour(s))  SARS Coronavirus 2 Ag (30 min TAT) - Nasal Swab (BD Veritor Kit)     Status: Abnormal   Collection Time: 02/27/19  4:16 PM   Specimen: Nasal Swab (BD Veritor Kit)  Result Value Ref Range Status   SARS Coronavirus 2 Ag POSITIVE (A) NEGATIVE Final    Comment: RESULT CALLED TO, READ BACK BY AND VERIFIED WITH: CALLED TO C.REED RN AT 0355 ON [id number] BY SROY (NOTE) SARS-CoV-2 antigen PRESENT. Positive results indicate the presence of viral antigens, but clinical correlation with patient history and other diagnostic information is necessary to determine patient infection status.  Positive results do not rule out bacterial infection or co-infection  with other viruses. False positive results are rare but can occur, and confirmatory RT-PCR testing may be appropriate in some circumstances. The expected result is Negative. Fact Sheet for Patients: PodPark.tn Fact Sheet for Providers: GiftContent.is  This test is not yet approved or cleared by the  [place] Architectural technologist and  has been authorized for detection and/or diagnosis of SARS-CoV-2 by FDA under an Print production planner (EUA).  This EUA will remain in effect (meaning this test can be used) for the duration of  the  COVID- 19 declaration under Section 564(b)(1) of the Act, 21 U.S.C. section 360bbb-3(b)(1), unless the authorization is terminated or revoked sooner. Performed at [facility], [address] [place], [place] [address]   Blood Culture (routine x 2)     Status: None (Preliminary result)   Collection Time: 02/27/19  5:43 PM   Specimen: BLOOD  Result Value Ref Range Status   Specimen Description   Final    BLOOD RIGHT ANTECUBITAL Performed at [facility], [address] [place], [place] [address]    Special Requests   Final    BOTTLES DRAWN AEROBIC AND ANAEROBIC Blood Culture adequate volume Performed at [facility], [address] [place], [place] [address]    Culture   Final    NO GROWTH 4 DAYS Performed at [facility], [address] [place], [place] [address]    Report Status PENDING  Incomplete    FURTHER DISCHARGE INSTRUCTIONS:  Get Medicines reviewed and adjusted: Please take all your medications with you for your next visit with your Primary MD  Laboratory/radiological data: Please request your Primary MD to go over all hospital tests and procedure/radiological results at the follow up, please ask your Primary MD to get all Hospital records sent to his/her office.  In some cases, they will be blood work, cultures and biopsy results pending at the time of your discharge. Please request that your primary care M.D. goes through all the records of your hospital data and follows up on these results.  Also Note the following: If you experience worsening of your admission symptoms, develop shortness of breath, life threatening emergency, suicidal or homicidal thoughts you must seek medical attention immediately by calling 911 or calling your MD immediately  if symptoms less severe.  You must read complete instructions/literature along with all the possible adverse reactions/side effects for all the Medicines you take and that have been  prescribed to you. Take any new Medicines after you have completely understood and accpet all the possible adverse reactions/side effects.   Do not drive when taking Pain medications or sleeping medications (Benzodaizepines)  Do not take more than prescribed Pain, Sleep and Anxiety Medications. It is not advisable to combine anxiety,sleep and pain medications without talking with your primary care practitioner  Special Instructions: If you have smoked or chewed Tobacco  in the last 2 yrs please stop smoking, stop any regular Alcohol  and or any Recreational drug use.  Wear Seat belts while driving.  Please note: You were cared for by a hospitalist during your hospital stay. Once you are discharged, your primary care physician will handle any further medical issues. Please note that NO REFILLS for any discharge medications will be authorized once you are discharged, as it is imperative that you return to your primary care physician (or establish a relationship with a primary care physician if you do not have one) for your post hospital discharge needs so that they can reassess your need for medications and monitor your lab values.  Total Time spent coordinating discharge including counseling, education and face to face time equals 35 minutes.  SignedOren Binet 03/03/2019 8:36 AM

## 2019-03-03 NOTE — Plan of Care (Signed)
Patient remains Aox4, has 1 dose of Rem. Left, likely D/c today, vitals remained wnl, sats maintained throughout shift on Room air, safety precautions maintained, continur poc  Problem: Education: Goal: Knowledge of risk factors and measures for prevention of condition will improve Outcome: Progressing   Problem: Coping: Goal: Psychosocial and spiritual needs will be supported Outcome: Progressing   Problem: Respiratory: Goal: Will maintain a patent airway Outcome: Progressing Goal: Complications related to the disease process, condition or treatment will be avoided or minimized Outcome: Progressing   Problem: Education: Goal: Knowledge of General Education information will improve Description: Including pain rating scale, medication(s)/side effects and non-pharmacologic comfort measures Outcome: Progressing   Problem: Health Behavior/Discharge Planning: Goal: Ability to manage health-related needs will improve Outcome: Progressing   Problem: Clinical Measurements: Goal: Ability to maintain clinical measurements within normal limits will improve Outcome: Progressing Goal: Will remain free from infection Outcome: Progressing Goal: Diagnostic test results will improve Outcome: Progressing Goal: Respiratory complications will improve Outcome: Progressing Goal: Cardiovascular complication will be avoided Outcome: Progressing   Problem: Activity: Goal: Risk for activity intolerance will decrease Outcome: Progressing   Problem: Nutrition: Goal: Adequate nutrition will be maintained Outcome: Progressing   Problem: Coping: Goal: Level of anxiety will decrease Outcome: Progressing   Problem: Elimination: Goal: Will not experience complications related to bowel motility Outcome: Progressing Goal: Will not experience complications related to urinary retention Outcome: Progressing   Problem: Pain Managment: Goal: General experience of comfort will improve Outcome:  Progressing   Problem: Safety: Goal: Ability to remain free from injury will improve Outcome: Progressing   Problem: Skin Integrity: Goal: Risk for impaired skin integrity will decrease Outcome: Progressing

## 2019-03-03 NOTE — Discharge Instructions (Signed)
Person Under Monitoring Name: Colleen Garrison  Location: [address] [place] [address] [place] [address]   Infection Prevention Recommendations for Individuals Confirmed to have, or Being Evaluated for, 2019 Novel Coronavirus (COVID-19) Infection Who Receive Care at Home  Individuals who are confirmed to have, or are being evaluated for, COVID-19 should follow the prevention steps below until a healthcare provider or local or state health department says they can return to normal activities.  Stay home except to get medical care You should restrict activities outside your home, except for getting medical care. Do not go to work, school, or public areas, and do not use public transportation or taxis.  Call ahead before visiting your doctor Before your medical appointment, call the healthcare provider and tell them that you have, or are being evaluated for, COVID-19 infection. This will help the healthcare provider's office take steps to keep other people from getting infected. Ask your healthcare provider to call the local or state health department.  Monitor your symptoms Seek prompt medical attention if your illness is worsening (e.g., difficulty breathing). Before going to your medical appointment, call the healthcare provider and tell them that you have, or are being evaluated for, COVID-19 infection. Ask your healthcare provider to call the local or state health department.  Wear a facemask You should wear a facemask that covers your nose and mouth when you are in the same room with other people and when you visit a healthcare provider. People who live with or visit you should also wear a facemask while they are in the same room with you.  Separate yourself from other people in your home As much as possible, you should stay in a different room from other people in your home. Also, you should use a separate bathroom, if available.  Avoid sharing household items You  should not share dishes, drinking glasses, cups, eating utensils, towels, bedding, or other items with other people in your home. After using these items, you should wash them thoroughly with soap and water.  Cover your coughs and sneezes Cover your mouth and nose with a tissue when you cough or sneeze, or you can cough or sneeze into your sleeve. Throw used tissues in a lined trash can, and immediately wash your hands with soap and water for at least 20 seconds or use an alcohol-based hand rub.  Wash your [facility] your hands often and thoroughly with soap and water for at least 20 seconds. You can use an alcohol-based hand sanitizer if soap and water are not available and if your hands are not visibly dirty. Avoid touching your eyes, nose, and mouth with unwashed hands.   Prevention Steps for Caregivers and Household Members of Individuals Confirmed to have, or Being Evaluated for, COVID-19 Infection Being Cared for in the Home  If you live with, or provide care at home for, a person confirmed to have, or being evaluated for, COVID-19 infection please follow these guidelines to prevent infection:  Follow healthcare provider's instructions Make sure that you understand and can help the patient follow any healthcare provider instructions for all care.  Provide for the patient's basic needs You should help the patient with basic needs in the home and provide support for getting groceries, prescriptions, and other personal needs.  Monitor the patient's symptoms If they are getting sicker, call his or her medical provider and tell them that the patient has, or is being evaluated for, COVID-19 infection. This will help the healthcare  provider's office take steps to keep other people from getting infected. Ask the healthcare provider to call the local or state health department.  Limit the number of people who have contact with the patient  If possible, have only one caregiver for the  patient.  Other household members should stay in another home or place of residence. If this is not possible, they should stay  in another room, or be separated from the patient as much as possible. Use a separate bathroom, if available.  Restrict visitors who do not have an essential need to be in the home.  Keep older adults, very young children, and other sick people away from the patient Keep older adults, very young children, and those who have compromised immune systems or chronic health conditions away from the patient. This includes people with chronic heart, lung, or kidney conditions, diabetes, and cancer.  Ensure good ventilation Make sure that shared spaces in the home have good air flow, such as from an air conditioner or an opened window, weather permitting.  Wash your hands often  Wash your hands often and thoroughly with soap and water for at least 20 seconds. You can use an alcohol based hand sanitizer if soap and water are not available and if your hands are not visibly dirty.  Avoid touching your eyes, nose, and mouth with unwashed hands.  Use disposable paper towels to dry your hands. If not available, use dedicated cloth towels and replace them when they become wet.  Wear a facemask and gloves  Wear a disposable facemask at all times in the room and gloves when you touch or have contact with the patient's blood, body fluids, and/or secretions or excretions, such as sweat, saliva, sputum, nasal mucus, vomit, urine, or feces.  Ensure the mask fits over your nose and mouth tightly, and do not touch it during use.  Throw out disposable facemasks and gloves after using them. Do not reuse.  Wash your hands immediately after removing your facemask and gloves.  If your personal clothing becomes contaminated, carefully remove clothing and launder. Wash your hands after handling contaminated clothing.  Place all used disposable facemasks, gloves, and other waste in a lined  container before disposing them with other household waste.  Remove gloves and wash your hands immediately after handling these items.  Do not share dishes, glasses, or other household items with the patient  Avoid sharing household items. You should not share dishes, drinking glasses, cups, eating utensils, towels, bedding, or other items with a patient who is confirmed to have, or being evaluated for, COVID-19 infection.  After the person uses these items, you should wash them thoroughly with soap and water.  Wash laundry thoroughly  Immediately remove and wash clothes or bedding that have blood, body fluids, and/or secretions or excretions, such as sweat, saliva, sputum, nasal mucus, vomit, urine, or feces, on them.  Wear gloves when handling laundry from the patient.  Read and follow directions on labels of laundry or clothing items and detergent. In general, wash and dry with the warmest temperatures recommended on the label.  Clean all areas the individual has used often  Clean all touchable surfaces, such as counters, tabletops, doorknobs, bathroom fixtures, toilets, phones, keyboards, tablets, and bedside tables, every day. Also, clean any surfaces that may have blood, body fluids, and/or secretions or excretions on them.  Wear gloves when cleaning surfaces the patient has come in contact with.  Use a diluted bleach solution (e.g., dilute bleach with  1 part bleach and 10 parts water) or a household disinfectant with a label that says EPA-registered for coronaviruses. To make a bleach solution at home, add 1 tablespoon of bleach to 1 quart (4 cups) of water. For a larger supply, add  cup of bleach to 1 gallon (16 cups) of water.  Read labels of cleaning products and follow recommendations provided on product labels. Labels contain instructions for safe and effective use of the cleaning product including precautions you should take when applying the product, such as wearing gloves or  eye protection and making sure you have good ventilation during use of the product.  Remove gloves and wash hands immediately after cleaning.  Monitor yourself for signs and symptoms of illness Caregivers and household members are considered close contacts, should monitor their health, and will be asked to limit movement outside of the home to the extent possible. Follow the monitoring steps for close contacts listed on the symptom monitoring form.   ? If you have additional questions, contact your local health department or call the epidemiologist on call at [phone] (available 24/7). ? This guidance is subject to change. For the most up-to-date guidance from [facility], please refer to their website: [email or web address]

## 2019-03-03 NOTE — Evaluation (Addendum)
Occupational Therapy Evaluation Patient Details Name: Colleen Garrison MRN: [id number] DOB: 02-Mar-1932 Today's Date: 03/03/2019    History of Present Illness 85 y/o female from home presented to hospital with hypoxic respiratory failure sec to COVID 19. PMHx: depression, memory loss, thyroid disease.   Clinical Impression   This 85 y/o female presents with the above. PTA pt reports living alone and performing ADL, iADL and functional mobility independently. Pt completing room level mobility without AD, LB and standing grooming ADL at supervision-minguard assist level throughout this session. Pt on RA with SpO2 >90%, max HR noted 100. Pt reports daughter to assist after discharge home. She will benefit from continued acute OT services to maximize her safety and independence with ADL and mobility. Do not anticipate pt will require follow up therapy services after discharge home.     Follow Up Recommendations  Supervision/Assistance - 24 hour;No OT follow up(close to 24hr initially)    Equipment Recommendations  None recommended by OT           Precautions / Restrictions Precautions Precautions: Fall Restrictions Weight Bearing Restrictions: No      Mobility Bed Mobility Overal bed mobility: Modified Independent                Transfers Overall transfer level: Needs assistance Equipment used: None Transfers: Sit to/from Stand Sit to Stand: Supervision         General transfer comment: line management and set up needed    Balance Overall balance assessment: Mild deficits observed, not formally tested                                         ADL either performed or assessed with clinical judgement   ADL Overall ADL's : Needs assistance/impaired Eating/Feeding: Modified independent;Sitting Eating/Feeding Details (indicate cue type and reason): seated in recliner to eat breakfast end of session Grooming: Wash/dry face;Oral care;Brushing  hair;Supervision/safety;Standing Grooming Details (indicate cue type and reason): standing at sink in bathroom Upper Body Bathing: Set up;Min guard;Sitting   Lower Body Bathing: Min guard;Sit to/from stand   Upper Body Dressing : Supervision/safety;Set up;Sitting   Lower Body Dressing: Min guard;Sit to/from stand Lower Body Dressing Details (indicate cue type and reason): pt donning socks seated EOB without difficulty  Toilet Transfer: Min guard;Supervision/safety;Ambulation Toilet Transfer Details (indicate cue type and reason): simulated via transfer to recliner, room level mobility Toileting- Clothing Manipulation and Hygiene: Min guard;Sit to/from Nurse, children's Details (indicate cue type and reason): discussed having shower seat in shower during bathing ADL for increased safety and for taking rest breaks PRN with pt verbalizing understanding Functional mobility during ADLs: Min guard;Supervision/safety General ADL Comments: educated in general activity progression and energy conservation during functional tasks after return home; also educated in social distancing/quarantine after return home to reduce risk of spreading infection to others                         Pertinent Vitals/Pain Pain Assessment: No/denies pain     Hand Dominance     Extremity/Trunk Assessment Upper Extremity Assessment Upper Extremity Assessment: Overall WFL for tasks assessed   Lower Extremity Assessment Lower Extremity Assessment: Defer to PT evaluation   Cervical / Trunk Assessment Cervical / Trunk Assessment: Normal   Communication Communication Communication: No difficulties   Cognition Arousal/Alertness: Awake/alert Behavior During Therapy: [facility] for  tasks assessed/performed Overall Cognitive Status: No family/caregiver present to determine baseline cognitive functioning                                 General Comments: overall appears [facility] for basic tasks  though note some mild STM deficits during session    General Comments       Exercises     Shoulder Instructions      Home Living Family/patient expects to be discharged to:: Private residence Living Arrangements: Alone Available Help at Discharge: Friend(s);Neighbor Type of Home: Apartment Home Access: Level entry     Home Layout: One level     Bathroom Shower/Tub: Occupational psychologist: Standard Bathroom Accessibility: Yes   Home Equipment: Environmental consultant - 2 wheels;Shower seat          Prior Functioning/Environment Level of Independence: Independent        Comments: drives        OT Problem List: Decreased strength;Decreased activity tolerance;Impaired balance (sitting and/or standing);Cardiopulmonary status limiting activity;Decreased knowledge of use of DME or AE      OT Treatment/Interventions: Self-care/ADL training;Therapeutic exercise;Energy conservation;DME and/or AE instruction;Therapeutic activities;Patient/family education;Balance training    OT Goals(Current goals can be found in the care plan section) Acute Rehab OT Goals Patient Stated Goal: hopeful for home today OT Goal Formulation: With patient Time For Goal Achievement: 03/17/19 Potential to Achieve Goals: Good  OT Frequency: Min 2X/week   Barriers to D/C:            Co-evaluation              AM-PAC OT "6 Clicks" Daily Activity     Outcome Measure Help from another person eating meals?: None Help from another person taking care of personal grooming?: None Help from another person toileting, which includes using toliet, bedpan, or urinal?: None Help from another person bathing (including washing, rinsing, drying)?: A Little Help from another person to put on and taking off regular upper body clothing?: None Help from another person to put on and taking off regular lower body clothing?: A Little 6 Click Score: 22   End of Session Nurse Communication: Mobility  status  Activity Tolerance: Patient tolerated treatment well Patient left: in chair;with call bell/phone within reach;with chair alarm set  OT Visit Diagnosis: Unsteadiness on feet (R26.81);Muscle weakness (generalized) (M62.81)                Time: 1610-9604 OT Time Calculation (min): 36 min Charges:  OT General Charges $OT Visit: 1 Visit OT Evaluation $OT Eval Moderate Complexity: 1 Mod OT Treatments $Self Care/Home Management : 8-22 mins  Colleen Garrison, OT [facility] Pager [phone] Office [phone]   Colleen Garrison 03/03/2019, 1:14 PM

## 2019-03-03 NOTE — Plan of Care (Signed)

## 2019-03-04 LAB — CULTURE, BLOOD (ROUTINE X 2)
Culture: NO GROWTH
Special Requests: ADEQUATE

## 2019-03-05 LAB — GLUCOSE, CAPILLARY: Glucose-Capillary: 144 mg/dL — ABNORMAL HIGH (ref 70–99)

## 2019-03-07 ENCOUNTER — Emergency Department (HOSPITAL_BASED_OUTPATIENT_CLINIC_OR_DEPARTMENT_OTHER): Payer: Medicare HMO

## 2019-03-07 ENCOUNTER — Inpatient Hospital Stay (HOSPITAL_BASED_OUTPATIENT_CLINIC_OR_DEPARTMENT_OTHER)
Admission: EM | Admit: 2019-03-07 | Discharge: 2019-03-10 | DRG: 177 | Disposition: A | Discharge status: Home Health | Payer: Medicare HMO | Attending: Internal Medicine | Admitting: Internal Medicine

## 2019-03-07 ENCOUNTER — Other Ambulatory Visit: Payer: Self-pay

## 2019-03-07 ENCOUNTER — Encounter (HOSPITAL_BASED_OUTPATIENT_CLINIC_OR_DEPARTMENT_OTHER): Payer: Self-pay | Admitting: Emergency Medicine

## 2019-03-07 DIAGNOSIS — E039 Hypothyroidism, unspecified: Secondary | ICD-10-CM | POA: Diagnosis present

## 2019-03-07 DIAGNOSIS — F329 Major depressive disorder, single episode, unspecified: Secondary | ICD-10-CM | POA: Diagnosis present

## 2019-03-07 DIAGNOSIS — R7989 Other specified abnormal findings of blood chemistry: Secondary | ICD-10-CM | POA: Diagnosis present

## 2019-03-07 DIAGNOSIS — J9601 Acute respiratory failure with hypoxia: Secondary | ICD-10-CM

## 2019-03-07 DIAGNOSIS — E669 Obesity, unspecified: Secondary | ICD-10-CM | POA: Diagnosis present

## 2019-03-07 DIAGNOSIS — R531 Weakness: Secondary | ICD-10-CM

## 2019-03-07 DIAGNOSIS — U071 COVID-19: Principal | ICD-10-CM | POA: Diagnosis present

## 2019-03-07 DIAGNOSIS — E860 Dehydration: Secondary | ICD-10-CM | POA: Diagnosis present

## 2019-03-07 DIAGNOSIS — G9341 Metabolic encephalopathy: Secondary | ICD-10-CM | POA: Diagnosis present

## 2019-03-07 DIAGNOSIS — Z6832 Body mass index (BMI) 32.0-32.9, adult: Secondary | ICD-10-CM | POA: Diagnosis not present

## 2019-03-07 DIAGNOSIS — Z885 Allergy status to narcotic agent status: Secondary | ICD-10-CM | POA: Diagnosis not present

## 2019-03-07 DIAGNOSIS — K449 Diaphragmatic hernia without obstruction or gangrene: Secondary | ICD-10-CM | POA: Diagnosis present

## 2019-03-07 DIAGNOSIS — Z87891 Personal history of nicotine dependence: Secondary | ICD-10-CM | POA: Diagnosis not present

## 2019-03-07 DIAGNOSIS — Z79890 Hormone replacement therapy (postmenopausal): Secondary | ICD-10-CM | POA: Diagnosis not present

## 2019-03-07 DIAGNOSIS — N179 Acute kidney failure, unspecified: Secondary | ICD-10-CM | POA: Diagnosis present

## 2019-03-07 DIAGNOSIS — R110 Nausea: Secondary | ICD-10-CM | POA: Diagnosis present

## 2019-03-07 DIAGNOSIS — I700 Atherosclerosis of aorta: Secondary | ICD-10-CM | POA: Diagnosis present

## 2019-03-07 DIAGNOSIS — J1282 Pneumonia due to coronavirus disease 2019: Secondary | ICD-10-CM | POA: Diagnosis present

## 2019-03-07 DIAGNOSIS — J432 Centrilobular emphysema: Secondary | ICD-10-CM | POA: Diagnosis present

## 2019-03-07 DIAGNOSIS — F0390 Unspecified dementia without behavioral disturbance: Secondary | ICD-10-CM | POA: Diagnosis present

## 2019-03-07 DIAGNOSIS — Z79899 Other long term (current) drug therapy: Secondary | ICD-10-CM | POA: Diagnosis not present

## 2019-03-07 LAB — COMPREHENSIVE METABOLIC PANEL
ALT: 35 U/L (ref 0–44)
AST: 25 U/L (ref 15–41)
Albumin: 3.7 g/dL (ref 3.5–5.0)
Alkaline Phosphatase: 48 U/L (ref 38–126)
Anion gap: 11 (ref 5–15)
BUN: 18 mg/dL (ref 8–23)
CO2: 30 mmol/L (ref 22–32)
Calcium: 9.5 mg/dL (ref 8.9–10.3)
Chloride: 93 mmol/L — ABNORMAL LOW (ref 98–111)
Creatinine, Ser: 1.07 mg/dL — ABNORMAL HIGH (ref 0.44–1.00)
GFR calc Af Amer: 55 mL/min — ABNORMAL LOW (ref >60)
GFR calc non Af Amer: 47 mL/min — ABNORMAL LOW (ref >60)
Glucose, Bld: 116 mg/dL — ABNORMAL HIGH (ref 70–99)
Potassium: 4 mmol/L (ref 3.5–5.1)
Sodium: 134 mmol/L — ABNORMAL LOW (ref 135–145)
Total Bilirubin: 1.2 mg/dL (ref 0.3–1.2)
Total Protein: 7.2 g/dL (ref 6.5–8.1)

## 2019-03-07 LAB — CBC WITH DIFFERENTIAL/PLATELET
Abs Immature Granulocytes: 0.57 10*3/uL — ABNORMAL HIGH (ref 0.00–0.07)
Basophils Absolute: 0.1 10*3/uL (ref 0.0–0.1)
Basophils Relative: 1 %
Eosinophils Absolute: 0 10*3/uL (ref 0.0–0.5)
Eosinophils Relative: 0 %
HCT: 42.3 % (ref 36.0–46.0)
Hemoglobin: 14.1 g/dL (ref 12.0–15.0)
Immature Granulocytes: 5 %
Lymphocytes Relative: 10 %
Lymphs Abs: 1.3 10*3/uL (ref 0.7–4.0)
MCH: 31.3 pg (ref 26.0–34.0)
MCHC: 33.3 g/dL (ref 30.0–36.0)
MCV: 94 fL (ref 80.0–100.0)
Monocytes Absolute: 1.5 10*3/uL — ABNORMAL HIGH (ref 0.1–1.0)
Monocytes Relative: 12 %
Neutro Abs: 9.2 10*3/uL — ABNORMAL HIGH (ref 1.7–7.7)
Neutrophils Relative %: 72 %
Platelets: 351 10*3/uL (ref 150–400)
RBC: 4.5 MIL/uL (ref 3.87–5.11)
RDW: 12.4 % (ref 11.5–15.5)
WBC: 12.8 10*3/uL — ABNORMAL HIGH (ref 4.0–10.5)
nRBC: 0 % (ref 0.0–0.2)

## 2019-03-07 LAB — URINALYSIS, MICROSCOPIC (REFLEX)

## 2019-03-07 LAB — URINALYSIS, ROUTINE W REFLEX MICROSCOPIC
Bilirubin Urine: NEGATIVE
Glucose, UA: NEGATIVE mg/dL
Ketones, ur: NEGATIVE mg/dL
Leukocytes,Ua: NEGATIVE
Nitrite: NEGATIVE
Protein, ur: 30 mg/dL — AB
Specific Gravity, Urine: 1.025 (ref 1.005–1.030)
pH: 6.5 (ref 5.0–8.0)

## 2019-03-07 LAB — TROPONIN I (HIGH SENSITIVITY): Troponin I (High Sensitivity): 6 ng/L (ref <18)

## 2019-03-07 MED ORDER — ONDANSETRON HCL 4 MG/2ML IJ SOLN
4.0000 mg | Freq: Once | INTRAMUSCULAR | Status: AC
  Administered 2019-03-07: 4 mg via INTRAVENOUS
  Filled 2019-03-07: qty 2

## 2019-03-07 MED ORDER — SODIUM CHLORIDE 0.9 % IV BOLUS
1000.0000 mL | Freq: Once | INTRAVENOUS | Status: AC
  Administered 2019-03-07: 1000 mL via INTRAVENOUS

## 2019-03-07 NOTE — ED Notes (Signed)
Pt's O2 sats noted to be 88% on RA w/ uniform pleth.  2L O2 via [place] placed on pt w/ improvement in O2 sat to 93%.

## 2019-03-07 NOTE — Progress Notes (Signed)
Was recently treated in [facility] for Covid infection, completed remdesivir infusion and hypoxia resolved and went home.  And patient developed generalized weakness and hypoxic and came to [facility].  Patient son also reported to [facility] ED that patient was not taking her meds, or mixed her meds at home. X-ray showed clear lungs, desat easily with minimal ambulation.  Be admitted to [facility] for physical therapy and weaning of oxygen.  Probably will need home care when ready to go home (if not a facility).

## 2019-03-07 NOTE — ED Notes (Signed)
Pt son states that pt has not been taking her medications, he lays them out for her and she has not been taking them.

## 2019-03-07 NOTE — ED Notes (Signed)
Spoke with pt Son Neysa Bonito and advised him that pt is being admitted.  Spoke with pt about this also.  Pt has tolerated water, ginger ale and saltines.  Pt expresses that she is hungry.  Brought her some graham crackers and pb crackers and advised her to notify us if she would like something more to eat.

## 2019-03-07 NOTE — ED Notes (Signed)
No further Troponin needed at this time per Dr. Billy Fischer

## 2019-03-07 NOTE — ED Triage Notes (Addendum)
Patient to ER for weakness and "upset stomach".  Reports this began last night.  Denies N/V/D, fever, cough, chills, shortness of breath-recent admission to [place].  Oriented to self, place. Unable to give correct date.  Unsure of why she was recently hospitalized.

## 2019-03-07 NOTE — ED Notes (Signed)
Portable chest x-ray at the bedside.  

## 2019-03-07 NOTE — ED Notes (Signed)
Pt notified that urine sample is needed, she feels that she can not go at this time.  Infusing fluid bolus

## 2019-03-07 NOTE — ED Notes (Signed)
Call from pt son Doroteo Bradford [phone]. Son states that pt has been getting progressively weaker since discharge home from [facility]. Son states that pt is in same shape as when she was recently admitted to the hospital.  Pt has not been eating, she has been feeling unwell and the lack of taste has made it difficult to encourgage her to take any meals.  She has been in bed for the majority of the last 2 days. Son states that she has been having severe nausea. Advised son that pt was not able to tell us what year it is or what she was hospitalized for and he states that she has been this way for the last few weeks, he also states that she has not been taking her memory pills due to feeling unwell. Asked son if he feels she is ok to be in the room without visitor and he states that he feels she is.

## 2019-03-07 NOTE — ED Notes (Signed)
Helped pt to [facility].  Pt spo2 dropped to 85% but recovered back to 92-94% on RA with rest back on stretcher.  Pt given water, she took several sips and would like ginger ale and crackers which I will bring her.

## 2019-03-07 NOTE — ED Notes (Signed)
Pt ate only about 5 bites of her TV dinner and advised me that she was done with it.  Bagged up her clothes and labeled them.

## 2019-03-07 NOTE — ED Provider Notes (Signed)
[facility] EMERGENCY DEPARTMENT Provider Note   CSN: [id number] Arrival date & time: 03/07/19  1319  LEVEL 5 CAVEAT - DEMENTIA   History Chief Complaint  Patient presents with  . Weakness    Colleen Garrison is a 85 y.o. female.  HPI  85 year old female presents with weakness and poor p.o. intake.  History is taken from son mostly over the phone.  In the room, the patient tells me that she feels nausea but has no other complaints.  The patient was diagnosed with the novel coronavirus on the 23rd of this month and was admitted for about 4 days.  Son wonders if she was discharged too early as she lives at home and was not taking her meds as prescribed.  He would call her and see if she is taking her meds and she would say yes but going over there today he found her in a weakened state.  Some confusion though it is hard to tell if this is new or not because of her dementia.  Otherwise, he is mostly concerned about dehydration and early discharge.  The patient denies any painful complaints.  Past Medical History:  Diagnosis Date  . Depression   . Hiatal hernia   . Memory loss   . Thyroid disease     Patient Active Problem List   Diagnosis Date Noted  . Acute hypoxemic respiratory failure due to severe acute respiratory syndrome coronavirus 2 (SARS-CoV-2) disease ([place]) 02/28/2019  . Hypothyroidism 02/28/2019  . Depression   . COVID-19 virus infection 02/27/2019    History reviewed. No pertinent surgical history.   OB History   No obstetric history on file.     History reviewed. No pertinent family history.  Social History   Tobacco Use  . Smoking status: Former Research scientist (life sciences)  . Smokeless tobacco: Never Used  Substance Use Topics  . Alcohol use: No  . Drug use: Never    Home Medications Prior to Admission medications   Medication Sig Start Date End Date Taking? Authorizing Provider  albuterol (VENTOLIN HFA) 108 (90 Base) MCG/ACT inhaler Inhale 2 puffs into  the lungs every 6 (six) hours as needed for wheezing. 09/06/18   [provider]  ascorbic acid (VITAMIN C) 100 MG tablet Take 500 mg by mouth daily.     [provider]  b complex vitamins capsule Take by mouth.    [provider]  Cholecalciferol 25 MCG (1000 UT) tablet Take 1,000 Units by mouth daily.     [provider]  dexamethasone (DECADRON) 4 MG tablet Take 1 tablet (4 mg total) by mouth daily. 03/03/19   Ghimire, Henreitta Leber, MD  donepezil (ARICEPT) 5 MG tablet Take 5 mg by mouth at bedtime. 01/09/19   [provider]  escitalopram (LEXAPRO) 10 MG tablet Take 10 mg by mouth daily.  08/12/16   [provider]  estradiol (ESTRACE) 0.1 MG/GM vaginal cream Place 1 Applicatorful vaginally at bedtime as needed (itching).  03/17/17   [provider]  ibuprofen (ADVIL) 800 MG tablet Take 800 mg by mouth daily as needed for moderate pain.  09/05/17   [provider]  levothyroxine (SYNTHROID) 100 MCG tablet Take 100 mcg by mouth daily before breakfast.    [provider]  levothyroxine (SYNTHROID, LEVOTHROID) 75 MCG tablet Take 75 mcg by mouth daily.    [provider]  LORazepam (ATIVAN) 0.5 MG tablet Take 0.5 mg by mouth at bedtime as needed for sleep.  07/04/18  [provider]  Multiple Vitamin (MULTIVITAMIN) capsule Take 1 capsule by mouth daily.    [provider]  omega-3 fish oil (MAXEPA) 1000 MG CAPS capsule Take 1 capsule by mouth daily.     [provider]  promethazine-dextromethorphan (PROMETHAZINE-DM) 6.25-15 MG/5ML syrup Take 5 mLs by mouth every 6 (six) hours as needed for cough. 09/12/16   [provider]  traZODone (DESYREL) 50 MG tablet Take 50 mg by mouth daily.  08/12/16 05/21/19  [provider]  vitamin E 100 UNIT capsule Take 100 Units by mouth daily.    [provider]    Allergies    Codeine  Review of Systems   Review of Systems   Constitutional: Negative for fever.  Respiratory: Positive for cough and shortness of breath.   Cardiovascular: Negative for chest pain.  Gastrointestinal: Positive for nausea. Negative for abdominal pain and vomiting.  Neurological: Negative for headaches.  All other systems reviewed and are negative.   Physical Exam Updated Vital Signs BP 100/72   Pulse 88   Temp 98.3 F (36.8 C) (Oral)   Resp 14   SpO2 95%   Physical Exam Vitals and nursing note reviewed.  Constitutional:      General: She is not in acute distress.    Appearance: She is well-developed. She is not ill-appearing or diaphoretic.  HENT:     Head: Normocephalic and atraumatic.     Right Ear: External ear normal.     Left Ear: External ear normal.     Nose: Nose normal.  Eyes:     General:        Right eye: No discharge.        Left eye: No discharge.  Cardiovascular:     Rate and Rhythm: Normal rate and regular rhythm.     Heart sounds: Normal heart sounds.  Pulmonary:     Effort: Pulmonary effort is normal.     Breath sounds: Normal breath sounds.  Abdominal:     Palpations: Abdomen is soft.     Tenderness: There is no abdominal tenderness.  Skin:    General: Skin is warm and dry.  Neurological:     Mental Status: She is alert. She is disoriented.     Comments: CN 3-12 grossly intact. 5/5 strength in all 4 extremities. Grossly normal sensation.   Psychiatric:        Mood and Affect: Mood is not anxious.     ED Results / Procedures / Treatments   Labs (all labs ordered are listed, but only abnormal results are displayed) Labs Reviewed  COMPREHENSIVE METABOLIC PANEL - Abnormal; Notable for the following components:      Result Value   Sodium 134 (*)    Chloride 93 (*)    Glucose, Bld 116 (*)    Creatinine, Ser 1.07 (*)    GFR calc non Af Amer 47 (*)    GFR calc Af Amer 55 (*)    All other components within normal limits  CBC WITH DIFFERENTIAL/PLATELET - Abnormal; Notable for the following  components:   WBC 12.8 (*)    Neutro Abs 9.2 (*)    Monocytes Absolute 1.5 (*)    Abs Immature Granulocytes 0.57 (*)    All other components within normal limits  URINALYSIS, ROUTINE W REFLEX MICROSCOPIC  TROPONIN I (HIGH SENSITIVITY)  TROPONIN I (HIGH SENSITIVITY)    EKG EKG Interpretation  Date/Time:  Thursday March 07 2019 13:32:58 EST Ventricular Rate:  84 PR  Interval:    QRS Duration: 89 QT Interval:  383 QTC Calculation: 453 R Axis:   82 Text Interpretation: Sinus rhythm Probable left atrial enlargement Borderline right axis deviation no significant change since Feb 27 2019 Confirmed by Pricilla Loveless [phone]) on 03/07/2019 1:43:07 PM   Radiology CT Head Wo Contrast  Result Date: 03/07/2019 CLINICAL DATA:  Progressing weakness. General and well feeling. Recent hospital discharge for COVID-19 infection. EXAM: CT HEAD WITHOUT CONTRAST TECHNIQUE: Contiguous axial images were obtained from the base of the skull through the vertex without intravenous contrast. COMPARISON:  01/16/2019 FINDINGS: Brain: No evidence of acute infarction, hemorrhage, hydrocephalus, extra-axial collection or mass lesion/mass effect. There is patchy white matter hypoattenuation bilaterally consistent with moderate chronic microvascular ischemic change. Vascular: No hyperdense vessel or unexpected calcification. Skull: Normal. Negative for fracture or focal lesion. Sinuses/Orbits: Globes and orbits are unremarkable. Sinuses are clear. Other: None. IMPRESSION: 1. No acute intracranial abnormalities. 2. Chronic microvascular ischemic change. Stable appearance from the recent prior study. Electronically Signed   By: Amie Portland M.D.   On: 03/07/2019 15:06   DG Chest Portable 1 View  Result Date: 03/07/2019 CLINICAL DATA:  Patient to ER for weakness and "upset stomach". Reports this began last night. Denies N/V/D, fever, cough, chills, shortness of breath-recent admission to [facility]. EXAM: PORTABLE CHEST 1 VIEW  COMPARISON:  Chest radiograph 02/27/2019 FINDINGS: Stable cardiomediastinal contours. Large hiatal hernia. The lungs are clear. No pneumothorax or large pleural effusion. No acute finding in the visualized skeleton. IMPRESSION: No evidence of active disease. Electronically Signed   By: Emmaline Kluver M.D.   On: 03/07/2019 14:14    Procedures Procedures (including critical care time)  Medications Ordered in ED Medications  sodium chloride 0.9 % bolus 1,000 mL (1,000 mLs Intravenous New Bag/Given 03/07/19 1359)  ondansetron (ZOFRAN) injection 4 mg (4 mg Intravenous Given 03/07/19 1412)    ED Course  I have reviewed the triage vital signs and the nursing notes.  Pertinent labs & imaging results that were available during my care of the patient were reviewed by me and considered in my medical decision making (see chart for details).    MDM Rules/Calculators/A&P                      I had a discussion with the son about course of action.  We will check labs and give IV fluids.  We can try p.o. challenge.  However she might not have a reason to come back into the hospital if we cannot find anything that would need emergent hospitalization for.  Does have mild dehydration but not really an AKI.  Urine pending when care transferred to Dr. Dalene Seltzer.  Colleen Garrison was evaluated in Emergency Department on 03/07/2019 for the symptoms described in the history of present illness. She was evaluated in the context of the global COVID-19 pandemic, which necessitated consideration that the patient might be at risk for infection with the SARS-CoV-2 virus that causes COVID-19. Institutional protocols and algorithms that pertain to the evaluation of patients at risk for COVID-19 are in a state of rapid change based on information released by regulatory bodies including the CDC and federal and state organizations. These policies and algorithms were followed during the patient's care in the ED.  Final  Clinical Impression(s) / ED Diagnoses Final diagnoses:  None    Rx / DC Orders ED Discharge Orders    None       Pricilla Loveless, MD 03/07/19  1531  

## 2019-03-07 NOTE — ED Notes (Signed)
Pt called out and was upset about having not had dinner.  Offered her all the TV dinners we have, made her a meat loaf and mashed potato dinner and brought it to her.  Pt is under the impression that she has been in this ER "for 2 days".  Advised her that she got here today.  Gave her a battery pack to charge her phone, explained delay and that we are waiting for a hospital bed

## 2019-03-08 ENCOUNTER — Inpatient Hospital Stay (HOSPITAL_COMMUNITY): Payer: Medicare HMO

## 2019-03-08 DIAGNOSIS — R531 Weakness: Secondary | ICD-10-CM

## 2019-03-08 DIAGNOSIS — J9601 Acute respiratory failure with hypoxia: Secondary | ICD-10-CM

## 2019-03-08 DIAGNOSIS — U071 COVID-19: Principal | ICD-10-CM

## 2019-03-08 DIAGNOSIS — R110 Nausea: Secondary | ICD-10-CM | POA: Diagnosis present

## 2019-03-08 DIAGNOSIS — F0390 Unspecified dementia without behavioral disturbance: Secondary | ICD-10-CM

## 2019-03-08 DIAGNOSIS — G9341 Metabolic encephalopathy: Secondary | ICD-10-CM | POA: Diagnosis present

## 2019-03-08 DIAGNOSIS — G301 Alzheimer's disease with late onset: Secondary | ICD-10-CM | POA: Diagnosis present

## 2019-03-08 DIAGNOSIS — E039 Hypothyroidism, unspecified: Secondary | ICD-10-CM

## 2019-03-08 LAB — CBC
HCT: 38.9 % (ref 36.0–46.0)
Hemoglobin: 12.8 g/dL (ref 12.0–15.0)
MCH: 31.4 pg (ref 26.0–34.0)
MCHC: 32.9 g/dL (ref 30.0–36.0)
MCV: 95.3 fL (ref 80.0–100.0)
Platelets: 296 10*3/uL (ref 150–400)
RBC: 4.08 MIL/uL (ref 3.87–5.11)
RDW: 12.4 % (ref 11.5–15.5)
WBC: 9.2 10*3/uL (ref 4.0–10.5)
nRBC: 0 % (ref 0.0–0.2)

## 2019-03-08 LAB — C-REACTIVE PROTEIN: CRP: 12.7 mg/dL — ABNORMAL HIGH (ref <1.0)

## 2019-03-08 LAB — FERRITIN: Ferritin: 466 ng/mL — ABNORMAL HIGH (ref 11–307)

## 2019-03-08 LAB — D-DIMER, QUANTITATIVE: D-Dimer, Quant: 1.6 ug/mL-FEU — ABNORMAL HIGH (ref 0.00–0.50)

## 2019-03-08 LAB — FIBRINOGEN: Fibrinogen: 582 mg/dL — ABNORMAL HIGH (ref 210–475)

## 2019-03-08 LAB — PROCALCITONIN: Procalcitonin: <0.1 ng/mL

## 2019-03-08 LAB — ABO/RH: ABO/RH(D): A POS

## 2019-03-08 LAB — TSH: TSH: 1.338 u[IU]/mL (ref 0.350–4.500)

## 2019-03-08 LAB — LACTATE DEHYDROGENASE: LDH: 195 U/L — ABNORMAL HIGH (ref 98–192)

## 2019-03-08 MED ORDER — SODIUM CHLORIDE 0.9 % IV SOLN: INTRAVENOUS | Status: AC

## 2019-03-08 MED ORDER — ASCORBIC ACID 500 MG PO TABS
500.0000 mg | ORAL_TABLET | Freq: Every day | ORAL | Status: DC
  Administered 2019-03-08 – 2019-03-10 (×3): 500 mg via ORAL
  Filled 2019-03-08 (×3): qty 1

## 2019-03-08 MED ORDER — B COMPLEX VITAMINS PO CAPS: 1.0000 | ORAL_CAPSULE | Freq: Every day | ORAL | Status: DC

## 2019-03-08 MED ORDER — DEXAMETHASONE SODIUM PHOSPHATE 10 MG/ML IJ SOLN
6.0000 mg | Freq: Every day | INTRAMUSCULAR | Status: DC
  Administered 2019-03-08 – 2019-03-10 (×3): 6 mg via INTRAVENOUS
  Filled 2019-03-08 (×4): qty 1

## 2019-03-08 MED ORDER — ASPIRIN 81 MG PO CHEW
81.0000 mg | CHEWABLE_TABLET | Freq: Every day | ORAL | Status: DC
  Administered 2019-03-08 – 2019-03-10 (×3): 81 mg via ORAL
  Filled 2019-03-08 (×3): qty 1

## 2019-03-08 MED ORDER — ESCITALOPRAM OXALATE 10 MG PO TABS
10.0000 mg | ORAL_TABLET | Freq: Every day | ORAL | Status: DC
  Administered 2019-03-08 – 2019-03-10 (×3): 10 mg via ORAL
  Filled 2019-03-08 (×3): qty 1

## 2019-03-08 MED ORDER — GUAIFENESIN-DM 100-10 MG/5ML PO SYRP: 10.0000 mL | ORAL_SOLUTION | ORAL | Status: DC | PRN

## 2019-03-08 MED ORDER — ENOXAPARIN SODIUM 40 MG/0.4ML ~~LOC~~ SOLN
40.0000 mg | SUBCUTANEOUS | Status: DC
  Administered 2019-03-08 – 2019-03-10 (×3): 40 mg via SUBCUTANEOUS
  Filled 2019-03-08 (×3): qty 0.4

## 2019-03-08 MED ORDER — B COMPLEX-C PO TABS
1.0000 | ORAL_TABLET | Freq: Every day | ORAL | Status: DC
  Administered 2019-03-08 – 2019-03-10 (×3): 1 via ORAL
  Filled 2019-03-08 (×3): qty 1

## 2019-03-08 MED ORDER — ONDANSETRON HCL 4 MG/2ML IJ SOLN: 4.0000 mg | Freq: Four times a day (QID) | INTRAMUSCULAR | Status: DC | PRN

## 2019-03-08 MED ORDER — SODIUM CHLORIDE (PF) 0.9 % IJ SOLN
INTRAMUSCULAR | Status: AC
  Filled 2019-03-08: qty 50

## 2019-03-08 MED ORDER — VITAMIN D3 25 MCG (1000 UNIT) PO TABS
1000.0000 [IU] | ORAL_TABLET | Freq: Every day | ORAL | Status: DC
  Administered 2019-03-08 – 2019-03-10 (×3): 1000 [IU] via ORAL
  Filled 2019-03-08 (×3): qty 1

## 2019-03-08 MED ORDER — ZINC SULFATE 220 (50 ZN) MG PO CAPS
220.0000 mg | ORAL_CAPSULE | Freq: Every day | ORAL | Status: DC
  Administered 2019-03-08 – 2019-03-10 (×3): 220 mg via ORAL
  Filled 2019-03-08 (×2): qty 1

## 2019-03-08 MED ORDER — LEVOTHYROXINE SODIUM 100 MCG PO TABS
100.0000 ug | ORAL_TABLET | Freq: Every day | ORAL | Status: DC
  Administered 2019-03-08 – 2019-03-10 (×3): 100 ug via ORAL
  Filled 2019-03-08 (×3): qty 1

## 2019-03-08 MED ORDER — ALBUTEROL SULFATE HFA 108 (90 BASE) MCG/ACT IN AERS: 2.0000 | INHALATION_SPRAY | Freq: Four times a day (QID) | RESPIRATORY_TRACT | Status: DC | PRN

## 2019-03-08 MED ORDER — ACETAMINOPHEN 325 MG PO TABS
650.0000 mg | ORAL_TABLET | Freq: Four times a day (QID) | ORAL | Status: DC | PRN
  Administered 2019-03-09: 650 mg via ORAL
  Filled 2019-03-08 (×2): qty 2

## 2019-03-08 MED ORDER — MULTIVITAMINS PO CAPS: 1.0000 | ORAL_CAPSULE | Freq: Every day | ORAL | Status: DC

## 2019-03-08 MED ORDER — ADULT MULTIVITAMIN W/MINERALS CH
1.0000 | ORAL_TABLET | Freq: Every day | ORAL | Status: DC
  Administered 2019-03-08 – 2019-03-10 (×3): 1 via ORAL
  Filled 2019-03-08 (×3): qty 1

## 2019-03-08 MED ORDER — DONEPEZIL HCL 5 MG PO TABS
5.0000 mg | ORAL_TABLET | Freq: Every day | ORAL | Status: DC
  Administered 2019-03-08 – 2019-03-09 (×2): 5 mg via ORAL
  Filled 2019-03-08 (×2): qty 1

## 2019-03-08 MED ORDER — IOHEXOL 350 MG/ML SOLN
80.0000 mL | Freq: Once | INTRAVENOUS | Status: AC | PRN
  Administered 2019-03-08: 80 mL via INTRAVENOUS

## 2019-03-08 NOTE — Evaluation (Signed)
Physical Therapy Evaluation Patient Details Name: Colleen Garrison MRN: [id number] DOB: 1931/05/01 Today's Date: 03/08/2019   History of Present Illness  86 y/o female from home presented to hospital with hypoxic respiratory failure sec to COVID 19. PMHx: depression, memory loss, thyroid disease.  Clinical Impression  Pt admitted as above and presenting with functional mobility limitations 2* limited endurance and mild ambulatory balance deficits.  Pt should progress to dc home with prn assist of family.  This session, pt ambulating in room on RA and maintaining O2 sats 89% or higher - pt c/o fatigue but not SOB.Debe Coder PT Acute Rehabilitation Services Pager [phone] Office [phone]     Follow Up Recommendations Home health PT    Equipment Recommendations  None recommended by PT    Recommendations for Other Services       Precautions / Restrictions Precautions Precautions: Fall Restrictions Weight Bearing Restrictions: No      Mobility  Bed Mobility Overal bed mobility: Modified Independent                Transfers Overall transfer level: Needs assistance Equipment used: None Transfers: Sit to/from Stand Sit to Stand: Min guard         General transfer comment: steady assist  Ambulation/Gait Ambulation/Gait assistance: Min guard Gait Distance (Feet): 150 Feet Assistive device: 1 person hand held assist Gait Pattern/deviations: Step-through pattern Gait velocity: decr   General Gait Details: general instability but no LOB; pt reaching for furniture/walls to stabilize  Stairs            Wheelchair Mobility    Modified Rankin (Stroke Patients Only)       Balance Overall balance assessment: Needs assistance Sitting-balance support: No upper extremity supported;Feet supported Sitting balance-Leahy Scale: Good     Standing balance support: No upper extremity supported Standing balance-Leahy Scale: Fair                                Pertinent Vitals/Pain Pain Assessment: No/denies pain    Home Living Family/patient expects to be discharged to:: Private residence Living Arrangements: Alone Available Help at Discharge: Friend(s);Neighbor Type of Home: Apartment Home Access: Level entry     Home Layout: One level Home Equipment: [place] - 2 wheels;Shower seat;Walker - 4 wheels Additional Comments: Pt lives in retirement community and states she has as much assist as she needs including dtr who will stay during the day    Prior Function Level of Independence: Independent         Comments: drives     Hand Dominance        Extremity/Trunk Assessment   Upper Extremity Assessment Upper Extremity Assessment: Overall WFL for tasks assessed    Lower Extremity Assessment Lower Extremity Assessment: Overall WFL for tasks assessed    Cervical / Trunk Assessment Cervical / Trunk Assessment: Normal  Communication   Communication: No difficulties  Cognition Arousal/Alertness: Awake/alert Behavior During Therapy: WFL for tasks assessed/performed Overall Cognitive Status: No family/caregiver present to determine baseline cognitive functioning                                 General Comments: overall appears WFL for basic tasks though note some mild STM deficits during session       General Comments      Exercises     Assessment/Plan    PT Assessment Patient  needs continued PT services  PT Problem List Decreased strength;Decreased activity tolerance       PT Treatment Interventions Gait training;Functional mobility training;Therapeutic activities;Therapeutic exercise;Balance training;Neuromuscular re-education;Patient/family education    PT Goals (Current goals can be found in the Care Plan section)  Acute Rehab PT Goals Patient Stated Goal: HOME PT Goal Formulation: With patient Time For Goal Achievement: 03/14/19 Potential to Achieve Goals: Good     Frequency Min 3X/week   Barriers to discharge        Co-evaluation               AM-PAC PT "6 Clicks" Mobility  Outcome Measure Help needed turning from your back to your side while in a flat bed without using bedrails?: None Help needed moving from lying on your back to sitting on the side of a flat bed without using bedrails?: None Help needed moving to and from a bed to a chair (including a wheelchair)?: A Little Help needed standing up from a chair using your arms (e.g., wheelchair or bedside chair)?: A Little Help needed to walk in hospital room?: A Little Help needed climbing 3-5 steps with a railing? : A Lot 6 Click Score: 19    End of Session   Activity Tolerance: Patient tolerated treatment well Patient left: in chair;with call bell/phone within reach;with chair alarm set Nurse Communication: Mobility status PT Visit Diagnosis: Other abnormalities of gait and mobility (R26.89)    Time: 3419-6222 PT Time Calculation (min) (ACUTE ONLY): 40 min   Charges:   PT Evaluation $PT Eval Low Complexity: 1 Low PT Treatments $Gait Training: 8-22 mins        Mauro Kaufmann PT Acute Rehabilitation Services Pager [phone] Office [phone]   Colleen Garrison 03/08/2019, 5:28 PM

## 2019-03-08 NOTE — H&P (Signed)
History and Physical    Colleen Garrison LOV:[id number] DOB: Oct 01, 1931 DOA: 03/07/2019  PCP: Loraine Leriche., MD Patient coming from: [facility]  Chief Complaint: Generalized weakness, poor p.o. intake  HPI: Colleen Garrison is a 86 y.o. female with medical history significant of dementia, depression, hypothyroidism presented to the ED with complaints of generalized weakness and poor p.o. intake.  Patient was recently admitted to the hospital from 12/23-12/27 for acute hypoxic respiratory failure secondary to COVID-19 viral pneumonia.  She was treated with steroids and remdesivir.  Her oxygen saturation was stable on room air prior to discharge and patient did not qualify for home oxygen.  No family at bedside at this time.  Per ED provider documentation, son is concerned that patient was discharged too early and lives at home and has not been taking her medications as prescribed.  He is concerned that she is weak and dehydrated.  She has had some confusion although it is not clear whether this is new given patient's baseline dementia.  Patient states she feels sick.  Her breathing has not improved since she left the hospital and she continues to cough.  She is feeling nauseous and has not been able to eat food.  No vomiting, abdominal pain, or diarrhea.  Denies calf pain or swelling.  Denies history of blood clots.  ED Course: T-max 100.6 F.  Not tachycardic or significantly tachypneic.  Oxygen saturation 88% on room air, improved with 2 L supplemental oxygen.  WBC count 12.8.  BUN 18, creatinine 1.0.  High-sensitivity troponin negative.  UA not suggestive of infection.  Chest x-ray showing no active disease.  Head CT showing no acute intracranial abnormalities.  Review of Systems:  All systems reviewed and apart from history of presenting illness, are negative.  Past Medical History:  Diagnosis Date  . Depression   . Hiatal hernia   . Memory loss   . Thyroid disease     History  reviewed. No pertinent surgical history.   reports that she has quit smoking. She has never used smokeless tobacco. She reports that she does not drink alcohol or use drugs.  Allergies  Allergen Reactions  . Codeine Hives and Nausea And Vomiting    History reviewed. No pertinent family history.  Prior to Admission medications   Medication Sig Start Date End Date Taking? Authorizing Provider  albuterol (VENTOLIN HFA) 108 (90 Base) MCG/ACT inhaler Inhale 2 puffs into the lungs every 6 (six) hours as needed for wheezing. 09/06/18   [provider]  ascorbic acid (VITAMIN C) 100 MG tablet Take 500 mg by mouth daily.     [provider]  b complex vitamins capsule Take by mouth.    [provider]  Cholecalciferol 25 MCG (1000 UT) tablet Take 1,000 Units by mouth daily.     [provider]  dexamethasone (DECADRON) 4 MG tablet Take 1 tablet (4 mg total) by mouth daily. 03/03/19   Ghimire, Henreitta Leber, MD  donepezil (ARICEPT) 5 MG tablet Take 5 mg by mouth at bedtime. 01/09/19   [provider]  escitalopram (LEXAPRO) 10 MG tablet Take 10 mg by mouth daily.  08/12/16   [provider]  estradiol (ESTRACE) 0.1 MG/GM vaginal cream Place 1 Applicatorful vaginally at bedtime as needed (itching).  03/17/17   [provider]  ibuprofen (ADVIL) 800 MG tablet Take 800 mg by mouth daily as needed for moderate pain.  09/05/17   [provider]  levothyroxine (SYNTHROID) 100 MCG tablet Take  100 mcg by mouth daily before breakfast.    [provider]  levothyroxine (SYNTHROID, LEVOTHROID) 75 MCG tablet Take 75 mcg by mouth daily.    [provider]  LORazepam (ATIVAN) 0.5 MG tablet Take 0.5 mg by mouth at bedtime as needed for sleep.  07/04/18   [provider]  Multiple Vitamin (MULTIVITAMIN) capsule Take 1 capsule by mouth daily.    [provider]  omega-3 fish oil (MAXEPA) 1000 MG CAPS capsule Take 1 capsule  by mouth daily.     [provider]  promethazine-dextromethorphan (PROMETHAZINE-DM) 6.25-15 MG/5ML syrup Take 5 mLs by mouth every 6 (six) hours as needed for cough. 09/12/16   [provider]  traZODone (DESYREL) 50 MG tablet Take 50 mg by mouth daily.  08/12/16 05/21/19  [provider]  vitamin E 100 UNIT capsule Take 100 Units by mouth daily.    [provider]    Physical Exam: Vitals:   03/08/19 0245 03/08/19 0400 03/08/19 0412 03/08/19 0447  BP:  (!) 145/60  (!) 128/54  Pulse: 74 78  73  Resp:  (!) 22 18 18   Temp:  (!) 100.6 F (38.1 C)  98.3 F (36.8 C)  TempSrc:  Oral  Oral  SpO2: 92% 94%    Weight:  82.5 kg    Height:  5\' 3"  (1.6 m)      Physical Exam  Constitutional: She appears well-developed and well-nourished. No distress.  HENT:  Head: Normocephalic.  Eyes: Right eye exhibits no discharge. Left eye exhibits no discharge.  Cardiovascular: Normal rate, regular rhythm and intact distal pulses.  Pulmonary/Chest: Effort normal and breath sounds normal. She has no wheezes. She has no rales.  On 2 L supplemental oxygen via nasal cannula  Abdominal: Soft. Bowel sounds are normal. She exhibits no distension. There is no abdominal tenderness. There is no guarding.  Musculoskeletal:        General: No edema.     Cervical back: Neck supple.  Neurological: She is alert.  Oriented to person and place Speech fluent, tongue midline, no facial droop No focal motor or sensory deficit  Skin: Skin is warm and dry. She is not diaphoretic.     Labs on Admission: I have personally reviewed following labs and imaging studies  CBC: Recent Labs  Lab 03/02/19 0232 03/03/19 0042 03/07/19 1356  WBC 5.3 6.4 12.8*  NEUTROABS 3.4 5.1 9.2*  HGB 12.9 12.8 14.1  HCT 40.4 39.6 42.3  MCV 97.6 98.0 94.0  PLT 270 271 351   Basic Metabolic Panel: Recent Labs  Lab 03/02/19 0232 03/03/19 0042 03/07/19 1356  NA 141 140 134*  K 4.5 4.6 4.0  CL 102  97* 93*  CO2 28 25 30   GLUCOSE 148* 159* 116*  BUN 23 22 18   CREATININE 0.80 0.73 1.07*  CALCIUM 9.0 8.6* 9.5   GFR: Estimated Creatinine Clearance: 39.1 mL/min (A) (by C-G formula based on SCr of 1.07 mg/dL (H)). Liver Function Tests: Recent Labs  Lab 03/02/19 0232 03/03/19 0042 03/07/19 1356  AST 33 29 25  ALT 27 31 35  ALKPHOS 36* 33* 48  BILITOT 0.2* 0.4 1.2  PROT 6.7 7.0 7.2  ALBUMIN 3.6 3.7 3.7   No results for input(s): LIPASE, AMYLASE in the last 168 hours. No results for input(s): AMMONIA in the last 168 hours. Coagulation Profile: No results for input(s): INR, PROTIME in the last 168 hours. Cardiac Enzymes: No results for input(s): CKTOTAL, CKMB, CKMBINDEX, TROPONINI in  the last 168 hours. BNP (last 3 results) No results for input(s): PROBNP in the last 8760 hours. HbA1C: No results for input(s): HGBA1C in the last 72 hours. CBG: Recent Labs  Lab 03/02/19 1133 03/02/19 1558 03/02/19 2138 03/03/19 0729 03/03/19 1044  GLUCAP 116* 112* 144* 98 74   Lipid Profile: No results for input(s): CHOL, HDL, LDLCALC, TRIG, CHOLHDL, LDLDIRECT in the last 72 hours. Thyroid Function Tests: No results for input(s): TSH, T4TOTAL, FREET4, T3FREE, THYROIDAB in the last 72 hours. Anemia Panel: No results for input(s): VITAMINB12, FOLATE, FERRITIN, TIBC, IRON, RETICCTPCT in the last 72 hours. Urine analysis:    Component Value Date/Time   COLORURINE YELLOW 03/07/2019 1357   APPEARANCEUR CLEAR 03/07/2019 1357   LABSPEC 1.025 03/07/2019 1357   PHURINE 6.5 03/07/2019 1357   GLUCOSEU NEGATIVE 03/07/2019 1357   HGBUR SMALL (A) 03/07/2019 1357   BILIRUBINUR NEGATIVE 03/07/2019 1357   KETONESUR NEGATIVE 03/07/2019 1357   PROTEINUR 30 (A) 03/07/2019 1357   NITRITE NEGATIVE 03/07/2019 1357   LEUKOCYTESUR NEGATIVE 03/07/2019 1357    Radiological Exams on Admission: CT Head Wo Contrast  Result Date: 03/07/2019 CLINICAL DATA:  Progressing weakness. General and well  feeling. Recent hospital discharge for COVID-19 infection. EXAM: CT HEAD WITHOUT CONTRAST TECHNIQUE: Contiguous axial images were obtained from the base of the skull through the vertex without intravenous contrast. COMPARISON:  01/16/2019 FINDINGS: Brain: No evidence of acute infarction, hemorrhage, hydrocephalus, extra-axial collection or mass lesion/mass effect. There is patchy white matter hypoattenuation bilaterally consistent with moderate chronic microvascular ischemic change. Vascular: No hyperdense vessel or unexpected calcification. Skull: Normal. Negative for fracture or focal lesion. Sinuses/Orbits: Globes and orbits are unremarkable. Sinuses are clear. Other: None. IMPRESSION: 1. No acute intracranial abnormalities. 2. Chronic microvascular ischemic change. Stable appearance from the recent prior study. Electronically Signed   By: Amie Portland M.D.   On: 03/07/2019 15:06   DG Chest Portable 1 View  Result Date: 03/07/2019 CLINICAL DATA:  Patient to ER for weakness and "upset stomach". Reports this began last night. Denies N/V/D, fever, cough, chills, shortness of breath-recent admission to [facility]. EXAM: PORTABLE CHEST 1 VIEW COMPARISON:  Chest radiograph 02/27/2019 FINDINGS: Stable cardiomediastinal contours. Large hiatal hernia. The lungs are clear. No pneumothorax or large pleural effusion. No acute finding in the visualized skeleton. IMPRESSION: No evidence of active disease. Electronically Signed   By: Emmaline Kluver M.D.   On: 03/07/2019 14:14    EKG: Independently reviewed.  Sinus rhythm, no significant change since prior tracing.  Assessment/Plan Principal Problem:   Acute respiratory failure with hypoxia (HCC) Active Problems:   COVID-19 virus infection   Generalized weakness   Acute metabolic encephalopathy   Nausea   Acute hypoxic respiratory failure, tested positive for COVID-19 12/23 Patient was recently admitted to the hospital from 12/23-12/27 for acute hypoxic  respiratory failure secondary to COVID-19 viral pneumonia.  She was treated with steroids and remdesivir.  Her oxygen saturation was stable on room air prior to discharge and patient did not qualify for home oxygen.  In the ED, oxygen saturation 88% on room air, improved with 2 L supplemental oxygen.  Does have fever and mild leukocytosis, however, chest x-ray not suggestive of pneumonia.  Lungs clear on exam. There remains concern for possible PE given risk of thromboembolism with COVID-19 infection. -Check D-dimer level, if elevated, CTA to rule out PE -Already received a course of remdesivir during recent hospitalization -IV Decadron 6 mg daily -Vitamin C, zinc -Antitussives as needed -Tylenol  as needed -Inhaler as needed -Check inflammatory markers including ferritin, fibrinogen, CRP, LDH  -Check procalcitonin level -Airborne and contact precautions -Continuous pulse ox -Supplemental oxygen as needed to keep oxygen saturation above 90% -Blood culture x2  Acute metabolic encephalopathy, history of dementia Son reported some confusion, unclear if this is a change from her baseline.  Currently oriented to person place and answering questions appropriately.  Mild confusion could be due to to acute viral illness.  No focal neuro deficit.  Head CT negative for acute intracranial abnormalities. -Continue to monitor  Generalized weakness, nausea/anorexia Suspect related to acute viral illness.  BUN 18, creatinine 1.0.  No significant change in renal function from baseline.  Abdominal exam benign. No vomiting or diarrhea reported.  LFTs normal. -Received 1 L fluid bolus in the ED.  Continue gentle IV fluid hydration. -Zofran as needed for nausea -Has a history of hypothyroidism.  Check TSH level. -PT evaluation  Pharmacy med rec pending.  DVT prophylaxis: Lovenox Code Status: Full code Family Communication: No family available at this time. Disposition Plan: Anticipate discharge after  clinical improvement. Consults called: None Admission status: It is my clinical opinion that admission to INPATIENT is reasonable and necessary in this 86 y.o. female presenting with acute hypoxic respiratory failure in the setting of recently diagnosed COVID-19 viral infection.  Has a new supplemental oxygen requirement.  Also has generalized weakness and needs PT evaluation.  Given the aforementioned, the predictability of an adverse outcome is felt to be significant. I expect that the patient will require at least 2 midnights in the hospital to treat this condition.   The medical decision making on this patient was of high complexity and the patient is at high risk for clinical deterioration, therefore this is a level 3 visit.  John Giovanni MD [facility] Pager [phone]  If 7PM-7AM, please contact night-coverage [email or web address] Password [facility]  03/08/2019, 5:38 AM

## 2019-03-08 NOTE — Progress Notes (Signed)
Progress Note    Colleen Garrison  WOE:[id number] DOB: 1932/01/19  DOA: 03/07/2019 PCP: Loraine Leriche., MD    Brief Narrative:   Chief complaint: Follow-up generalized weakness and poor oral intake in the setting of recent COVID-19 pneumonia.  Medical records reviewed and are as summarized below:  Colleen Garrison is an 86 y.o. female with a PMH of dementia, depression and hypothyroidism status post hospitalization 02/27/2019-03/03/2019 for treatment of acute hypoxic respiratory failure secondary to COVID-19 viral pneumonia treated with steroids and remdesivir who was brought back to the hospital today by her family with a chief complaint of weakness associated with poor oral intake, ongoing cough, nausea and feeling unwell in general.  Upon initial evaluation in the ED, temperature max was 100.6 F, oxygen saturation was 88% on room air, WBC 12.8.  Chest x-ray was negative for active disease and CT of the head showed no acute intracranial abnormalities.  Assessment/Plan:   Principal Problem:   Acute respiratory failure and generalized weakness with hypoxia (HCC) in the setting of recent COVID-19 viral pneumonia Chest x-ray personally reviewed, lungs clear.  Patient had finished her treatment course of remdesivir and steroids.  She was placed on 2 L of oxygen with improvement in her oxygen saturation.  D-dimer elevated at 1.60.  Will need to rule out pulmonary embolism, so CT angiogram ordered: No evidence of pulmonary embolism but did show severe mixed central lobar and paraseptal emphysematous changes, no evidence of viral pneumonia.  CT scan personally reviewed.  PT/OT evaluations.  Active Problems:   Severe mixed central lobar and paraseptal emphysema Respiratory status stable.    Aortic atherosclerosis Start low-dose aspirin.    Hiatal hernia Large hiatal hernia containing the entirety of the stomach without evidence of gastric volvulus noted incidentally on CT.   Acute metabolic encephalopathy in the setting of dementia CT head negative for acute findings.  Supportive care. Resume Aricept.  Appears to be improved.    Nausea/anorexia and generalized weakness Likely from Covid.  Continue gentle IVF and anti-emetics PRN.    Hypothyroidism TSH WNL. Resume Synthroid.    Depression Resume Lexapro.   Obesity        Body mass index is 32.22 kg/m.   Family Communication/Anticipated D/C date and plan/Code Status   DVT prophylaxis: Lovenox ordered. Code Status: Full Code.  Family Communication: Message left for daughter. Disposition Plan: From home.  Will need PT/OT evaluations to see if she is able to return home post discharge.   Medical Consultants:    None.   Anti-Infectives:    None  Subjective:   Colleen Garrison was sitting up bathing when I examined her.  She says she feels OK.  No nausea or vomiting.  Has not yet had anything to eat.  Bowels moving OK.  No diarrhea.  No SOB.  Objective:    Vitals:   03/08/19 0245 03/08/19 0400 03/08/19 0412 03/08/19 0447  BP:  (!) 145/60  (!) 128/54  Pulse: 74 78  73  Resp:  (!) _0 Temp:  (!) 100.6 F (38.1 C)  98.3 F (36.8 C)  TempSrc:  Oral  Oral  SpO2: 92% 94%    Weight:  82.5 kg    Height:  _1  (1.6 m)      Intake/Output Summary (Last 24 hours) at 03/08/2019 0834 Last data filed at 03/08/2019 0037 Gross per 24 hour  Intake 1120 ml  Output -  Net 1120 ml   [employer]  03/08/19 0400  Weight: 82.5 kg    Exam: General: No acute distress. Cardiovascular: Heart sounds show a regular rate, and rhythm. No gallops or rubs. No murmurs. No JVD. Lungs: Clear to auscultation bilaterally with diminished breath sounds. No rales, rhonchi or wheezes. Abdomen: Soft, nontender, nondistended with normal active bowel sounds. No masses. No hepatosplenomegaly. Neurological: Alert and oriented 2. Moves all extremities 4 with equal strength. Cranial nerves II through XII grossly  intact. Skin: Warm and dry. No rashes or lesions. Extremities: No clubbing or cyanosis. No edema. Pedal pulses 2+. Psychiatric: Mood and affect are normal. Insight and judgment are fair.      Data Reviewed:   I have personally reviewed following labs and imaging studies:  Labs: Labs show the following:   Basic Metabolic Panel: Recent Labs  Lab 03/02/19 0232 03/03/19 0042 03/07/19 1356  NA 141 140 134*  K 4.5 4.6 4.0  CL 102 97* 93*  CO2 _0 GLUCOSE 148* 159* 116*  BUN _1 CREATININE 0.80 0.73 1.07*  CALCIUM 9.0 8.6* 9.5   GFR Estimated Creatinine Clearance: 39.1 mL/min (A) (by C-G formula based on SCr of 1.07 mg/dL (H)). Liver Function Tests: Recent Labs  Lab 03/02/19 0232 03/03/19 0042 03/07/19 1356  AST 33 29 25  ALT 27 31 35  ALKPHOS 36* 33* 48  BILITOT 0.2* 0.4 1.2  PROT 6.7 7.0 7.2  ALBUMIN 3.6 3.7 3.7   CBC: Recent Labs  Lab 03/02/19 0232 03/03/19 0042 03/07/19 1356 03/08/19 0543  WBC 5.3 6.4 12.8* 9.2  NEUTROABS 3.4 5.1 9.2*  --   HGB 12.9 12.8 14.1 12.8  HCT 40.4 39.6 42.3 38.9  MCV 97.6 98.0 94.0 95.3  PLT 270 271 351 296   CBG: Recent Labs  Lab 03/02/19 1133 03/02/19 1558 03/02/19 2138 03/03/19 0729 03/03/19 1044  GLUCAP 116* 112* 144* 98 74   D-Dimer: Recent Labs    03/08/19 0543  DDIMER 1.60*   Thyroid function studies: Recent Labs    03/08/19 0543  TSH 1.338   Anemia work up: Recent Labs    03/08/19 0543  FERRITIN 466*   Sepsis Labs: Recent Labs  Lab 03/02/19 0232 03/03/19 0042 03/07/19 1356 03/08/19 0543  PROCALCITON  --   --   --  <0.10  WBC 5.3 6.4 12.8* 9.2    Microbiology Recent Results (from the past 240 hour(s))  SARS Coronavirus 2 Ag (30 min TAT) - Nasal Swab (BD Veritor Kit)     Status: Abnormal   Collection Time: 02/27/19  4:16 PM   Specimen: Nasal Swab (BD Veritor Kit)  Result Value Ref Range Status   SARS Coronavirus 2 Ag POSITIVE (A) NEGATIVE Final    Comment: RESULT CALLED  TO, READ BACK BY AND VERIFIED WITH: CALLED TO C.REED RN AT [phone] ON [id number] BY SROY (NOTE) SARS-CoV-2 antigen PRESENT. Positive results indicate the presence of viral antigens, but clinical correlation with patient history and other diagnostic information is necessary to determine patient infection status.  Positive results do not rule out bacterial infection or co-infection  with other viruses. False positive results are rare but can occur, and confirmatory RT-PCR testing may be appropriate in some circumstances. The expected result is Negative. Fact Sheet for Patients: PodPark.tn Fact Sheet for Providers: GiftContent.is  This test is not yet approved or cleared by the [place] FDA and  has been authorized for detection and/or diagnosis of SARS-CoV-2 by FDA under an Emergency Use Authorization (EUA).  This EUA will remain in effect (meaning this test can be used) for the duration of  the COVID- 19 declaration under Section 564(b)(1) of the Act, 21 U.S.C. section 360bbb-3(b)(1), unless the authorization is terminated or revoked sooner. Performed at [facility], [address] [place], [place] [address]   Blood Culture (routine x 2)     Status: None   Collection Time: 02/27/19  5:43 PM   Specimen: BLOOD  Result Value Ref Range Status   Specimen Description   Final    BLOOD RIGHT ANTECUBITAL Performed at [facility], [address] [place], [place] [address]    Special Requests   Final    BOTTLES DRAWN AEROBIC AND ANAEROBIC Blood Culture adequate volume Performed at [facility], [address] [place], [place] [address]    Culture   Final    NO GROWTH 5 DAYS Performed at [facility], [place] [address] [place], [place] [address]    Report Status 03/04/2019 FINAL  Final    Procedures and diagnostic studies:  CT Head Wo Contrast  Result Date: 03/07/2019 CLINICAL DATA:   Progressing weakness. General and well feeling. Recent hospital discharge for COVID-19 infection. EXAM: CT HEAD WITHOUT CONTRAST TECHNIQUE: Contiguous axial images were obtained from the base of the skull through the vertex without intravenous contrast. COMPARISON:  01/16/2019 FINDINGS: Brain: No evidence of acute infarction, hemorrhage, hydrocephalus, extra-axial collection or mass lesion/mass effect. There is patchy white matter hypoattenuation bilaterally consistent with moderate chronic microvascular ischemic change. Vascular: No hyperdense vessel or unexpected calcification. Skull: Normal. Negative for fracture or focal lesion. Sinuses/Orbits: Globes and orbits are unremarkable. Sinuses are clear. Other: None. IMPRESSION: 1. No acute intracranial abnormalities. 2. Chronic microvascular ischemic change. Stable appearance from the recent prior study. Electronically Signed   By: Lajean Manes M.D.   On: 03/07/2019 15:06   DG Chest Portable 1 View  Result Date: 03/07/2019 CLINICAL DATA:  Patient to ER for weakness and "upset stomach". Reports this began last night. Denies N/V/D, fever, cough, chills, shortness of breath-recent admission to [place]. EXAM: PORTABLE CHEST 1 VIEW COMPARISON:  Chest radiograph 02/27/2019 FINDINGS: Stable cardiomediastinal contours. Large hiatal hernia. The lungs are clear. No pneumothorax or large pleural effusion. No acute finding in the visualized skeleton. IMPRESSION: No evidence of active disease. Electronically Signed   By: Audie Pinto M.D.   On: 03/07/2019 14:14    Medications:   . vitamin C  500 mg Oral Daily  . dexamethasone (DECADRON) injection  6 mg Intravenous Daily  . enoxaparin (LOVENOX) injection  40 mg Subcutaneous Q24H  . zinc sulfate  220 mg Oral Daily   Continuous Infusions: . sodium chloride 100 mL/hr at 03/08/19 0627     LOS: 1 day   Jacquelynn Cree  [facility] Pager [phone].   [facility] How to contact the [facility]  Attending or Consulting provider [place] or covering provider during after hours [place], for this patient?  1. Check the care team in [facility] and look for a) attending/consulting [facility] provider listed and b) the [facility] team listed 2. Log into [email or web address] and use [place]'s universal password to access. If you do not have the password, please contact the hospital operator. 3. Locate the [facility] provider you are looking for under [facility] and page to a number that you can be directly reached. 4. If you still have difficulty reaching the provider, please page the [facility] (  Director on Call) for the Hospitalists listed on amion for assistance.  03/08/2019, 8:34 AM

## 2019-03-09 MED ORDER — SODIUM CHLORIDE 0.9 % IV SOLN: INTRAVENOUS | Status: DC

## 2019-03-09 NOTE — Progress Notes (Signed)
Progress Note    Colleen Garrison  PXT:[id number] DOB: 1932-01-21  DOA: 03/07/2019 PCP: Loraine Leriche., MD    Brief Narrative:   Chief complaint: Follow-up generalized weakness and poor oral intake in the setting of recent COVID-19 pneumonia.  Medical records reviewed and are as summarized below:  Colleen Garrison is an 86 y.o. female with a PMH of dementia, depression and hypothyroidism status post hospitalization 02/27/2019-03/03/2019 for treatment of acute hypoxic respiratory failure secondary to COVID-19 viral pneumonia treated with steroids and remdesivir who was brought back to the hospital 03/07/2019 by her family with a chief complaint of weakness associated with poor oral intake, ongoing cough, nausea and feeling unwell in general.  Upon initial evaluation in the ED, temperature max was 100.6 F, oxygen saturation was 88% on room air, WBC 12.8.  Chest x-ray was negative for active disease and CT of the head showed no acute intracranial abnormalities.  Assessment/Plan:   Principal Problem:   Acute respiratory failure and generalized weakness with hypoxia (HCC) in the setting of recent COVID-19 viral pneumonia Chest x-ray personally reviewed, lungs clear.  Patient had finished her treatment course of remdesivir and steroids.  She was placed on 2 L of oxygen with improvement in her oxygen saturation.  D-dimer elevated at 1.60.  CT negative for pulmonary embolism but did show severe mixed central lobar and paraseptal emphysematous changes, no evidence of viral pneumonia.  Oxygen saturations 90-94% on room air at rest but drop to the upper 80's with activity.  PT/OT evaluations performed with home health services recommended.  Spoke with son about the possibility of her discharging home tomorrow.  He has some concerns about her level of fatigue and poor oral intake.  Will liberalize diet.  Active Problems:   AKI Baseline creatinine around 0.7.  Creatinine 1.07 on admission,  likely consistent with dehydration in the setting of poor oral intake.  Repeat labs in the morning.    Severe mixed central lobar and paraseptal emphysema Respiratory status stable at rest.    Aortic atherosclerosis Started on low-dose aspirin.    Hiatal hernia Large hiatal hernia containing the entirety of the stomach without evidence of gastric volvulus noted incidentally on CT.    Acute metabolic encephalopathy in the setting of dementia CT head negative for acute findings.  Supportive care. Resume Aricept.  Appears to be improved.    Nausea/anorexia and generalized weakness Likely from Covid.  Continue gentle IVF and anti-emetics PRN.    Hypothyroidism TSH WNL. Continue Synthroid.    Depression Continue Lexapro.   Obesity        Body mass index is 32.22 kg/m.   Family Communication/Anticipated D/C date and plan/Code Status   DVT prophylaxis: Lovenox ordered. Code Status: Full Code.  Family Communication: Son updated by telephone.  All questions answered. Disposition Plan: From home.  Home health therapy recommended at discharge: orders placed for PT/OT/RN and SW, as well as home oxygen DME.  Discussed with son anticipated d/c 03/09/18.   Medical Consultants:    None.   Anti-Infectives:    None  Subjective:   Colleen Garrison is upset this morning because she says no one has been in yet to see her, and that she hasn't gotten her breakfast.  No subjective SOB when at rest, no cough.  Lives alone, and does not feel strong enough to d/c today.  Objective:    Vitals:   03/08/19 0447 03/08/19 1327 03/08/19 2111 03/09/19 0547  BP: (!) 128/54 120/65 Marland Kitchen)  125/53 (!) 152/50  Pulse: 73 76 66 77  Resp: 18 19 18 20   Temp: 98.3 F (36.8 C) 97.7 F (36.5 C) 98 F (36.7 C) 97.6 F (36.4 C)  TempSrc: Oral Oral Oral Oral  SpO2:  94% 90% 91%  Weight:      Height:        Intake/Output Summary (Last 24 hours) at 03/09/2019 0759 Last data filed at 03/08/2019 1000 Gross  per 24 hour  Intake 240 ml  Output --  Net 240 ml   Filed Weights   03/08/19 0400  Weight: 82.5 kg    Exam: General: No acute distress. Cardiovascular: Heart sounds show a regular rate, and rhythm. No gallops or rubs. No murmurs. No JVD. Lungs: Clear to auscultation bilaterally with good air movement. No rales, rhonchi or wheezes. Abdomen: Soft, nontender, nondistended with normal active bowel sounds. No masses. No hepatosplenomegaly. Skin: Warm and dry. No rashes or lesions. Extremities: No clubbing or cyanosis. No edema. Pedal pulses 2+.    Data Reviewed:   I have personally reviewed following labs and imaging studies:  Labs: Labs show the following:   Basic Metabolic Panel: Recent Labs  Lab 03/03/19 0042 03/07/19 1356  NA 140 134*  K 4.6 4.0  CL 97* 93*  CO2 25 30  GLUCOSE 159* 116*  BUN 22 18  CREATININE 0.73 1.07*  CALCIUM 8.6* 9.5   GFR Estimated Creatinine Clearance: 39.1 mL/min (A) (by C-G formula based on SCr of 1.07 mg/dL (H)). Liver Function Tests: Recent Labs  Lab 03/03/19 0042 03/07/19 1356  AST 29 25  ALT 31 35  ALKPHOS 33* 48  BILITOT 0.4 1.2  PROT 7.0 7.2  ALBUMIN 3.7 3.7   CBC: Recent Labs  Lab 03/03/19 0042 03/07/19 1356 03/08/19 0543  WBC 6.4 12.8* 9.2  NEUTROABS 5.1 9.2*  --   HGB 12.8 14.1 12.8  HCT 39.6 42.3 38.9  MCV 98.0 94.0 95.3  PLT 271 351 296   CBG: Recent Labs  Lab 03/02/19 1133 03/02/19 1558 03/02/19 2138 03/03/19 0729 03/03/19 1044  GLUCAP 116* 112* 144* 98 74   D-Dimer: Recent Labs    03/08/19 0543  DDIMER 1.60*   Thyroid function studies: Recent Labs    03/08/19 0543  TSH 1.338   Anemia work up: Recent Labs    03/08/19 0543  FERRITIN 466*   Sepsis Labs: Recent Labs  Lab 03/03/19 0042 03/07/19 1356 03/08/19 0543  PROCALCITON  --   --  <0.10  WBC 6.4 12.8* 9.2    Microbiology Recent Results (from the past 240 hour(s))  SARS Coronavirus 2 Ag (30 min TAT) - Nasal Swab (BD Veritor  Kit)     Status: Abnormal   Collection Time: 02/27/19  4:16 PM   Specimen: Nasal Swab (BD Veritor Kit)  Result Value Ref Range Status   SARS Coronavirus 2 Ag POSITIVE (A) NEGATIVE Final    Comment: RESULT CALLED TO, READ BACK BY AND VERIFIED WITH: CALLED TO C.REED RN AT 0569 ON [id number] BY SROY (NOTE) SARS-CoV-2 antigen PRESENT. Positive results indicate the presence of viral antigens, but clinical correlation with patient history and other diagnostic information is necessary to determine patient infection status.  Positive results do not rule out bacterial infection or co-infection  with other viruses. False positive results are rare but can occur, and confirmatory RT-PCR testing may be appropriate in some circumstances. The expected result is Negative. Fact Sheet for Patients: PodPark.tn Fact Sheet for Providers: GiftContent.is  This test  is not yet approved or cleared by the [place] and  has been authorized for detection and/or diagnosis of SARS-CoV-2 by FDA under an Emergency Use Authorization (EUA).  This EUA will remain in effect (meaning this test can be used) for the duration of  the COVID- 19 declaration under Section 564(b)(1) of the Act, 21 U.S.C. section 360bbb-3(b)(1), unless the authorization is terminated or revoked sooner. Performed at [facility], [address] [place], [place] [address]   Blood Culture (routine x 2)     Status: None   Collection Time: 02/27/19  5:43 PM   Specimen: BLOOD  Result Value Ref Range Status   Specimen Description   Final    BLOOD RIGHT ANTECUBITAL Performed at [facility], [address] [place], [place] [address]    Special Requests   Final    BOTTLES DRAWN AEROBIC AND ANAEROBIC Blood Culture adequate volume Performed at [facility], [address] [place], [place] [address]    Culture   Final    NO GROWTH 5 DAYS Performed at  [facility], [place] [address] [place], [place] [address]    Report Status 03/04/2019 FINAL  Final  Culture, blood (routine x 2)     Status: None (Preliminary result)   Collection Time: 03/08/19  5:43 AM   Specimen: BLOOD  Result Value Ref Range Status   Specimen Description BLOOD LEFT ARM  Final   Special Requests   Final    BOTTLES DRAWN AEROBIC ONLY Blood Culture adequate volume Performed at [facility], [place] [address], [place], [place] [address]    Culture PENDING  Incomplete   Report Status PENDING  Incomplete  Culture, blood (routine x 2)     Status: None (Preliminary result)   Collection Time: 03/08/19  5:43 AM   Specimen: BLOOD  Result Value Ref Range Status   Specimen Description BLOOD RIGHT ARM  Final   Special Requests   Final    BOTTLES DRAWN AEROBIC AND ANAEROBIC Blood Culture adequate volume Performed at [facility], [place] [address] [place], [place] [address]    Culture PENDING  Incomplete   Report Status PENDING  Incomplete    Procedures and diagnostic studies:  CT Head Wo Contrast  Result Date: 03/07/2019 CLINICAL DATA:  Progressing weakness. General and well feeling. Recent hospital discharge for COVID-19 infection. EXAM: CT HEAD WITHOUT CONTRAST TECHNIQUE: Contiguous axial images were obtained from the base of the skull through the vertex without intravenous contrast. COMPARISON:  01/16/2019 FINDINGS: Brain: No evidence of acute infarction, hemorrhage, hydrocephalus, extra-axial collection or mass lesion/mass effect. There is patchy white matter hypoattenuation bilaterally consistent with moderate chronic microvascular ischemic change. Vascular: No hyperdense vessel or unexpected calcification. Skull: Normal. Negative for fracture or focal lesion. Sinuses/Orbits: Globes and orbits are unremarkable. Sinuses are clear. Other: None. IMPRESSION: 1. No acute intracranial abnormalities. 2. Chronic microvascular ischemic change. Stable appearance from the  recent prior study. Electronically Signed   By: Lajean Manes M.D.   On: 03/07/2019 15:06   CT ANGIO CHEST PE W OR WO CONTRAST  Result Date: 03/08/2019 CLINICAL DATA:  Elevated D-dimer. Hypoxic respiratory failure in patient with COVID-19 infection. Evaluate for pulmonary embolism. EXAM: CT ANGIOGRAPHY CHEST WITH CONTRAST TECHNIQUE: Multidetector CT imaging of the chest was performed using the standard protocol during bolus administration of intravenous contrast. Multiplanar CT image reconstructions and MIPs were obtained to evaluate the vascular anatomy.  CONTRAST:  40m OMNIPAQUE IOHEXOL 350 MG/ML SOLN COMPARISON:  None. FINDINGS: Examination is degraded due to patient respiratory artifact. Vascular Findings: There is adequate opacification of the pulmonary arterial system with the main pulmonary artery measuring 288 Hounsfield units. There are no discrete filling defects within the pulmonary arterial tree to suggest pulmonary embolism. Normal caliber of the main pulmonary artery. Borderline cardiomegaly.  Coronary artery calcifications. Scattered atherosclerotic plaque within a normal caliber thoracic aorta. The left vertebral artery is incidentally noted to arise directly from the aortic arch. Review of the MIP images confirms the above findings. ---------------------------------------------------------------------------------- Nonvascular Findings: Mediastinum/Lymph Nodes: Scattered mediastinal and bilateral hilar lymph nodes not enlarged by size criteria. No bulky mediastinal, hilar or axillary lymphadenopathy. Lungs/Pleura: Severe mixed centrilobular and paraseptal emphysematous change, most conspicuous within the lung apices and peripheral aspects of the bilateral upper lobes. Subsegmental atelectasis adjacent to large hiatal hernia which contains the entirety of the stomach. The central pulmonary airways appear patent. No discrete pulmonary nodules given limitation of the examination. Upper abdomen: As  above, there is a large hiatal hernia which contains the entirety of the stomach, without evidence of gastric volvulus. Musculoskeletal: No acute or aggressive osseous abnormalities. Regional soft tissues appear normal. IMPRESSION: 1. No evidence of pulmonary embolism. 2. Severe mixed centrilobular and paraseptal emphysematous change. Emphysema (ICD10-J43.9). 3. No focal airspace opacities or parenchymal evidence of atypical infection/COVID 19 infection. 4. Large hiatal hernia containing the entirety of the stomach without evidence of gastric volvulus. 5. Coronary artery calcifications. Aortic Atherosclerosis (ICD10-I70.0). Electronically Signed   By: JSandi MariscalM.D.   On: 03/08/2019 13:34   DG Chest Portable 1 View  Result Date: 03/07/2019 CLINICAL DATA:  Patient to ER for weakness and "upset stomach". Reports this began last night. Denies N/V/D, fever, cough, chills, shortness of breath-recent [facility] EXAM: PORTABLE CHEST 1 VIEW COMPARISON:  Chest radiograph 02/27/2019 FINDINGS: Stable cardiomediastinal contours. Large hiatal hernia. The lungs are clear. No pneumothorax or large pleural effusion. No acute finding in the visualized skeleton. IMPRESSION: No evidence of active disease. Electronically Signed   By: NAudie PintoM.D.   On: 03/07/2019 14:14    Medications:   . vitamin C  500 mg Oral Daily  . aspirin  81 mg Oral Daily  . B-complex with vitamin C  1 tablet Oral Daily  . cholecalciferol  1,000 Units Oral Daily  . dexamethasone (DECADRON) injection  6 mg Intravenous Daily  . donepezil  5 mg Oral QHS  . enoxaparin (LOVENOX) injection  40 mg Subcutaneous Q24H  . escitalopram  10 mg Oral Daily  . levothyroxine  100 mcg Oral QAC breakfast  . multivitamin with minerals  1 tablet Oral Daily  . zinc sulfate  220 mg Oral Daily   Continuous Infusions:    LOS: 2 days   CMargreta JourneyRama  [facility]  [facility] How to contact the TCam[facility]'S HospitalAttending or Consulting  provider 7[place]or covering provider during after hours 7Rosa Sanchez for this patient?  1. Check the care team in [facility]and look for a) attending/consulting TRH provider listed and b) the TAdvent[facility] Chapelteam listed 2. Log into [email or web address] and use [place]'s universal password to access. If you do not have the password, please contact the hospital operator. 3. Locate the TFort [facility] Hughes Memorial Hospitalprovider you are looking for under [facility] and page to a number that you can be directly reached. 4. If you still have difficulty reaching the provider, please page the [facility](Director  on Call) for the Hospitalists listed on amion for assistance.  03/09/2019, 7:59 AM

## 2019-03-09 NOTE — Evaluation (Addendum)
Occupational Therapy Evaluation and Discharge Patient Details Name: Colleen Garrison MRN: [id number] DOB: April 21, 1931 Today's Date: 03/09/2019    History of Present Illness 86 y/o female from home presented to hospital with hypoxic respiratory failure sec to COVID 19. PMHx: depression, memory loss, thyroid disease.   Clinical Impression   Pt with recent hospitalization and still with COVID. Pt had been living on her own, and family is concerned that she is not taking medication properly, and weak due to poor oral intake. Today Pt is min guard to supervision for transfers and in room mobility. SpO2 remained at 89 or higher on RA during all activity. She is able to perform LB ADL at min guard level from seated position as well as standing grooming tasks at sink at supervision level. She is pleasant, cooperative and eager to go home - but realizes that she will need assist due to baseline memory deficits. Pt has no further acute care OT needs at this time. Recommend 24 hour supervision initially, but do not recommend any OT follow up at this time.     Follow Up Recommendations  Supervision/Assistance - 24 hour;No OT follow up(initially)    Equipment Recommendations  None recommended by OT    Recommendations for Other Services       Precautions / Restrictions Precautions Precautions: Fall Restrictions Weight Bearing Restrictions: No      Mobility Bed Mobility Overal bed mobility: Modified Independent                Transfers Overall transfer level: Needs assistance Equipment used: None Transfers: Sit to/from Stand Sit to Stand: Min guard         General transfer comment: steady assist    Balance Overall balance assessment: Needs assistance Sitting-balance support: No upper extremity supported;Feet supported Sitting balance-Leahy Scale: Good     Standing balance support: No upper extremity supported Standing balance-Leahy Scale: Fair                              ADL either performed or assessed with clinical judgement   ADL Overall ADL's : Needs assistance/impaired Eating/Feeding: Modified independent;Sitting   Grooming: Wash/dry face;Oral care;Brushing hair;Supervision/safety;Standing Grooming Details (indicate cue type and reason): standing at sink in bathroom Upper Body Bathing: Set up;Min guard;Sitting   Lower Body Bathing: Min guard;Sit to/from stand   Upper Body Dressing : Supervision/safety;Set up;Sitting   Lower Body Dressing: Min guard;Sit to/from stand Lower Body Dressing Details (indicate cue type and reason): pt donning socks seated EOB without difficulty  Toilet Transfer: Min guard;Supervision/safety;Ambulation Toilet Transfer Details (indicate cue type and reason): simulated via transfer to recliner, room level mobility Toileting- Clothing Manipulation and Hygiene: Min guard;Sit to/from Nurse, children's Details (indicate cue type and reason): discussed having shower seat in shower during bathing ADL for increased safety and for taking rest breaks PRN with pt verbalizing understanding Functional mobility during ADLs: Min guard;Supervision/safety General ADL Comments: educated in general activity progression and energy conservation during functional tasks after return home; also educated in social distancing/quarantine after return home to reduce risk of spreading infection to others     Vision         Perception     Praxis      Pertinent Vitals/Pain Pain Assessment: No/denies pain     Hand Dominance     Extremity/Trunk Assessment Upper Extremity Assessment Upper Extremity Assessment: Overall WFL for tasks assessed   Lower  Extremity Assessment Lower Extremity Assessment: Overall WFL for tasks assessed   Cervical / Trunk Assessment Cervical / Trunk Assessment: Normal   Communication Communication Communication: No difficulties   Cognition Arousal/Alertness: Awake/alert Behavior During  Therapy: WFL for tasks assessed/performed Overall Cognitive Status: No family/caregiver present to determine baseline cognitive functioning                                     General Comments       Exercises     Shoulder Instructions      Home Living Family/patient expects to be discharged to:: Private residence Living Arrangements: Alone Available Help at Discharge: Family;Friend(s);Neighbor;Available 24 hours/day Type of Home: Apartment(ILF) Home Access: Level entry     Home Layout: One level     Bathroom Shower/Tub: Producer, television/film/video: Standard Bathroom Accessibility: Yes How Accessible: Accessible via walker Home Equipment: Walker - 2 wheels;Shower seat;Walker - 4 wheels   Additional Comments: Pt lives in retirement community and states she has as much assist as she needs including dtr who will stay during the day      Prior Functioning/Environment Level of Independence: Independent        Comments: drives        OT Problem List: Decreased strength;Decreased activity tolerance;Impaired balance (sitting and/or standing);Cardiopulmonary status limiting activity;Decreased knowledge of use of DME or AE      OT Treatment/Interventions:      OT Goals(Current goals can be found in the care plan section) Acute Rehab OT Goals Patient Stated Goal: HOME OT Goal Formulation: With patient Time For Goal Achievement: 03/23/19 Potential to Achieve Goals: Good  OT Frequency:     Barriers to D/C:            Co-evaluation              AM-PAC OT "6 Clicks" Daily Activity     Outcome Measure Help from another person eating meals?: None Help from another person taking care of personal grooming?: None Help from another person toileting, which includes using toliet, bedpan, or urinal?: None Help from another person bathing (including washing, rinsing, drying)?: A Little Help from another person to put on and taking off regular upper  body clothing?: None Help from another person to put on and taking off regular lower body clothing?: A Little 6 Click Score: 22   End of Session Nurse Communication: Mobility status  Activity Tolerance: Patient tolerated treatment well Patient left: in bed;with call bell/phone within reach  OT Visit Diagnosis: Unsteadiness on feet (R26.81);Muscle weakness (generalized) (M62.81)                Time: 1500-1520 OT Time Calculation (min): 20 min Charges:  OT General Charges $OT Visit: 1 Visit OT Evaluation $OT Eval Moderate Complexity: 1 Mod  Nyoka Cowden OTR/L Acute Rehabilitation Services Pager: [phone] Office: [phone]  Nyoka Cowden 03/09/2019, 5:12 PM

## 2019-03-09 NOTE — Progress Notes (Signed)
SATURATION QUALIFICATIONS: (This note is used to comply with regulatory documentation for home oxygen)  Patient Saturations on Room Air at Rest = 91%  Patient Saturations on Room Air while Ambulating = 90%  Patient Saturations on 0 Liters of oxygen while Ambulating = 90%  Please briefly explain why patient needs home oxygen: 

## 2019-03-10 LAB — BASIC METABOLIC PANEL
Anion gap: 8 (ref 5–15)
BUN: 20 mg/dL (ref 8–23)
CO2: 27 mmol/L (ref 22–32)
Calcium: 8.4 mg/dL — ABNORMAL LOW (ref 8.9–10.3)
Chloride: 102 mmol/L (ref 98–111)
Creatinine, Ser: 0.67 mg/dL (ref 0.44–1.00)
GFR calc Af Amer: >60 mL/min (ref >60)
GFR calc non Af Amer: >60 mL/min (ref >60)
Glucose, Bld: 112 mg/dL — ABNORMAL HIGH (ref 70–99)
Potassium: 3.8 mmol/L (ref 3.5–5.1)
Sodium: 137 mmol/L (ref 135–145)

## 2019-03-10 MED ORDER — DEXAMETHASONE 4 MG PO TABS: 4.0000 mg | ORAL_TABLET | Freq: Every day | ORAL | 0 refills | Status: AC

## 2019-03-10 MED ORDER — ASCORBIC ACID 500 MG PO TABS: 500.0000 mg | ORAL_TABLET | Freq: Every day | ORAL | 0 refills | Status: AC

## 2019-03-10 MED ORDER — ZINC SULFATE 220 (50 ZN) MG PO CAPS: 220.0000 mg | ORAL_CAPSULE | Freq: Every day | ORAL | 0 refills | Status: DC

## 2019-03-10 NOTE — TOC Progression Note (Signed)
Transition of Care [facility]) - Progression Note    Patient Details  Name: Colleen Garrison MRN: [id number] Date of Birth: 1931/03/14  [facility] CM/SW Contact  Armanda Heritage, RN Phone Number: 03/10/2019, 2:33 PM  Clinical Narrative:    CM spoke with patient's son. Patient set up with [facility] for HHPT/OT.     Expected Discharge Plan: Home w Home Health Services Barriers to Discharge: No Barriers Identified  Expected Discharge Plan and Services Expected Discharge Plan: Home w Home Health Services   Discharge Planning Services: CM Consult Post Acute Care Choice: Home Health Living arrangements for the past 2 months: Single Family Home Expected Discharge Date: 03/10/19               DME Arranged: N/A DME Agency: NA       HH Arranged: PT HH Agency: [facility] Date [facility] Agency Contacted: 03/10/19 Time HH Agency Contacted: 1432 Representative spoke with at [facility] Agency: Kandee Keen   Social Determinants of Health (SDOH) Interventions    Readmission Risk Interventions No flowsheet data found.

## 2019-03-10 NOTE — Progress Notes (Signed)
AVS given to patient and patient's daughter and explained. Medications and follow up appointments have been explained with pt and pt's daughter verbalizing understanding.

## 2019-03-10 NOTE — ED Provider Notes (Signed)
  Physical Exam  BP (!) 121/58 (BP Location: Left Arm)   Pulse (!) 56   Temp 97.7 F (36.5 C) (Oral)   Resp 17   Ht 5\' 3"  (1.6 m)   Wt 82.5 kg   SpO2 96%   BMI 32.22 kg/m   Physical Exam  ED Course/Procedures     Procedures  MDM  Received care from Dr. . Please see his note for prior hx, physical and care.  Recent COVID 19 infection, presenting with generalized weakness and nausea.  After assuming care, pt oxygenation decreased, noted to go to 80s after ambulation, and again decreased at rest. Will readmit for observation in setting of hypoxia, generalized weakness.    Criss Alvine, MD 03/10/19 2040

## 2019-03-12 NOTE — Discharge Summary (Signed)
[facility] Discharge Summary   Patient: Colleen Garrison EHU:[id number]   PCP: Loraine Leriche., MD DOB: 1931-10-01   Date of admission: 03/07/2019   Date of discharge: 03/10/2019     Discharge Diagnoses:  Principal diagnosis Acute respiratory failure and generalized weakness with hypoxia ([place]) in the setting of recent COVID-19 viral pneumonia  Active Problems:   COVID-19 virus infection   Acute hypoxemic respiratory failure due to severe acute respiratory syndrome coronavirus 2 (SARS-CoV-2) disease (HCC)   Hypothyroidism   Generalized weakness   Acute metabolic encephalopathy   Nausea   Dementia ([place])   Lab test positive for detection of COVID-19 virus   Admitted From: Home Disposition:  Home with home health  Recommendations for Outpatient Follow-up:  1. PCP: Follow-up with PCP in 1 week 2. Follow up LABS/TEST: None  Follow-up Information    Velazquez, Fransico Meadow., MD. Schedule an appointment as soon as possible for a visit in 1 week(s).   Specialty: Internal Medicine Contact information: [place] [phone]        Care, [facility] Follow up.   Specialty: Home Health Services Why: agency will provide home health physical therapy. agency will call you to schedule first visit. Contact information: [address] [place] [address] [phone]          Diet recommendation: Cardiac diet  Activity: The patient is advised to gradually reintroduce usual activities,as tolerated  Discharge Condition: good  Code Status: Full code   History of present illness: As per the H and P dictated on admission, "Colleen Garrison is a 86 y.o. female with medical history significant of dementia, depression, hypothyroidism presented to the ED with complaints of generalized weakness and poor p.o. intake.  Patient was recently admitted to the hospital from 12/23-12/27 for acute hypoxic respiratory failure secondary to  COVID-19 viral pneumonia.  She was treated with steroids and remdesivir.  Her oxygen saturation was stable on room air prior to discharge and patient did not qualify for home oxygen.  No family at bedside at this time.  Per ED provider documentation, son is concerned that patient was discharged too early and lives at home and has not been taking her medications as prescribed.  He is concerned that she is weak and dehydrated.  She has had some confusion although it is not clear whether this is new given patient's baseline dementia.  Patient states she feels sick.  Her breathing has not improved since she left the hospital and she continues to cough.  She is feeling nauseous and has not been able to eat food.  No vomiting, abdominal pain, or diarrhea.  Denies calf pain or swelling.  Denies history of blood clots."  Hospital Course:  Summary of her active problems in the hospital is as following.  Acute respiratory failure and generalized weakness with hypoxia (HCC) in the setting of recent COVID-19 viral pneumonia Chest x-ray personally reviewed, lungs clear.  Patient had finished her treatment course of remdesivir and steroids. Currently on room air. CT negative for pulmonary embolism but did show severe mixed central lobar and paraseptal emphysematous changes, no evidence of viral pneumonia.   Oxygen saturations 90-94% on room air at rest but drop to the upper 80's with activity.   PT/OT evaluations performed with home health services recommended.    Active Problems:   AKI Baseline creatinine around 0.7.  Creatinine 1.07 on admission, likely consistent with dehydration in the setting of poor oral intake.  Severe mixed central lobar and paraseptal emphysema Respiratory status stable at rest.    Aortic atherosclerosis Started on low-dose aspirin.    Hiatal hernia Large hiatal hernia containing the entirety of the stomach without evidence of gastric volvulus noted incidentally on CT.     Acute metabolic encephalopathy in the setting of dementia CT head negative for acute findings.  Supportive care. Resume Aricept.  Appears to be improved.    Nausea/anorexia and generalized weakness Likely from Covid.  Continue gentle IVF and anti-emetics PRN.    Hypothyroidism TSH WNL. Continue Synthroid.    Depression Continue Lexapro.  Obesity  Body mass index is 32.22 kg/m.  Patient was seen by physical therapy, who recommended Home health, which was arranged. On the day of the discharge the patient's vitals were stable, and no other acute medical condition were reported by patient. the patient was felt safe to be discharge at Home with Home health.  Consultants: none Procedures: none  DISCHARGE MEDICATION: Allergies as of 03/10/2019      Reactions   Codeine Hives, Nausea And Vomiting      Medication List    TAKE these medications   albuterol 108 (90 Base) MCG/ACT inhaler Commonly known as: VENTOLIN HFA Inhale 2 puffs into the lungs every 6 (six) hours as needed for wheezing.   ascorbic acid 500 MG tablet Commonly known as: VITAMIN C Take 1 tablet (500 mg total) by mouth daily.   b complex vitamins capsule Take 1 capsule by mouth daily.   Cholecalciferol 25 MCG (1000 UT) tablet Take 1,000 Units by mouth daily.   dexamethasone 4 MG tablet Commonly known as: DECADRON Take 1 tablet (4 mg total) by mouth daily for 5 days.   donepezil 5 MG tablet Commonly known as: ARICEPT Take 5 mg by mouth at bedtime.   escitalopram 10 MG tablet Commonly known as: LEXAPRO Take 10 mg by mouth daily.   estradiol 0.1 MG/GM vaginal cream Commonly known as: ESTRACE Place 1 Applicatorful vaginally at bedtime as needed (itching).   levothyroxine 100 MCG tablet Commonly known as: SYNTHROID Take 100 mcg by mouth daily before breakfast.   LORazepam 0.5 MG tablet Commonly known as: ATIVAN Take 0.5 mg by mouth at bedtime.   multivitamin capsule Take 1 capsule by mouth  daily.   OMEGA-3 FISH OIL PO Take 1 capsule by mouth daily.   VITAMIN C PO Take 1 tablet by mouth daily.   vitamin E 100 UNIT capsule Take 100 Units by mouth daily.   zinc sulfate 220 (50 Zn) MG capsule Take 1 capsule (220 mg total) by mouth daily.      Allergies  Allergen Reactions  . Codeine Hives and Nausea And Vomiting   Discharge Instructions    MyChart COVID-19 home monitoring program   Complete by: Mar 10, 2019    Is the patient willing to use the MyChart Mobile App for home monitoring?: No   Diet - low sodium heart healthy   Complete by: As directed    Increase activity slowly   Complete by: As directed      Discharge Exam: Filed Weights   03/08/19 0400  Weight: 82.5 kg   Vitals:   03/10/19 0433 03/10/19 1352  BP: (!) 149/63 (!) 121/58  Pulse: 63 (!) 56  Resp: 20 17  Temp: 97.6 F (36.4 C) 97.7 F (36.5 C)  SpO2: 92% 96%   General: Appear in no distress, no Rash; Oral Mucosa Clear, moist. no Abnormal Mass Or lumps Cardiovascular:  S1 and S2 Present, no Murmur, Respiratory: normal respiratory effort, Bilateral Air entry present and Clear to Auscultation, no Crackles, no wheezes Abdomen: Bowel Sound present, Soft and no tenderness, no hernia Extremities: no Pedal edema, no calf tenderness Neurology: alert and oriented to time, place, and person affect appropriate.  The results of significant diagnostics from this hospitalization (including imaging, microbiology, ancillary and laboratory) are listed below for reference.    Significant Diagnostic Studies: CT Head Wo Contrast  Result Date: 03/07/2019 CLINICAL DATA:  Progressing weakness. General and well feeling. Recent hospital discharge for COVID-19 infection. EXAM: CT HEAD WITHOUT CONTRAST TECHNIQUE: Contiguous axial images were obtained from the base of the skull through the vertex without intravenous contrast. COMPARISON:  01/16/2019 FINDINGS: Brain: No evidence of acute infarction, hemorrhage,  hydrocephalus, extra-axial collection or mass lesion/mass effect. There is patchy white matter hypoattenuation bilaterally consistent with moderate chronic microvascular ischemic change. Vascular: No hyperdense vessel or unexpected calcification. Skull: Normal. Negative for fracture or focal lesion. Sinuses/Orbits: Globes and orbits are unremarkable. Sinuses are clear. Other: None. IMPRESSION: 1. No acute intracranial abnormalities. 2. Chronic microvascular ischemic change. Stable appearance from the recent prior study. Electronically Signed   By: Amie Portland M.D.   On: 03/07/2019 15:06   CT ANGIO CHEST PE W OR WO CONTRAST  Result Date: 03/08/2019 CLINICAL DATA:  Elevated D-dimer. Hypoxic respiratory failure in patient with COVID-19 infection. Evaluate for pulmonary embolism. EXAM: CT ANGIOGRAPHY CHEST WITH CONTRAST TECHNIQUE: Multidetector CT imaging of the chest was performed using the standard protocol during bolus administration of intravenous contrast. Multiplanar CT image reconstructions and MIPs were obtained to evaluate the vascular anatomy. CONTRAST:  61mL OMNIPAQUE IOHEXOL 350 MG/ML SOLN COMPARISON:  None. FINDINGS: Examination is degraded due to patient respiratory artifact. Vascular Findings: There is adequate opacification of the pulmonary arterial system with the main pulmonary artery measuring 288 Hounsfield units. There are no discrete filling defects within the pulmonary arterial tree to suggest pulmonary embolism. Normal caliber of the main pulmonary artery. Borderline cardiomegaly.  Coronary artery calcifications. Scattered atherosclerotic plaque within a normal caliber thoracic aorta. The left vertebral artery is incidentally noted to arise directly from the aortic arch. Review of the MIP images confirms the above findings. ---------------------------------------------------------------------------------- Nonvascular Findings: Mediastinum/Lymph Nodes: Scattered mediastinal and bilateral  hilar lymph nodes not enlarged by size criteria. No bulky mediastinal, hilar or axillary lymphadenopathy. Lungs/Pleura: Severe mixed centrilobular and paraseptal emphysematous change, most conspicuous within the lung apices and peripheral aspects of the bilateral upper lobes. Subsegmental atelectasis adjacent to large hiatal hernia which contains the entirety of the stomach. The central pulmonary airways appear patent. No discrete pulmonary nodules given limitation of the examination. Upper abdomen: As above, there is a large hiatal hernia which contains the entirety of the stomach, without evidence of gastric volvulus. Musculoskeletal: No acute or aggressive osseous abnormalities. Regional soft tissues appear normal. IMPRESSION: 1. No evidence of pulmonary embolism. 2. Severe mixed centrilobular and paraseptal emphysematous change. Emphysema (ICD10-J43.9). 3. No focal airspace opacities or parenchymal evidence of atypical infection/COVID 19 infection. 4. Large hiatal hernia containing the entirety of the stomach without evidence of gastric volvulus. 5. Coronary artery calcifications. Aortic Atherosclerosis (ICD10-I70.0). Electronically Signed   By: Simonne Come M.D.   On: 03/08/2019 13:34   DG Chest Portable 1 View  Result Date: 03/07/2019 CLINICAL DATA:  Patient to ER for weakness and "upset stomach". Reports this began last night. Denies N/V/D, fever, cough, chills, shortness of breath-recent admission to [facility]. EXAM: PORTABLE CHEST 1  VIEW COMPARISON:  Chest radiograph 02/27/2019 FINDINGS: Stable cardiomediastinal contours. Large hiatal hernia. The lungs are clear. No pneumothorax or large pleural effusion. No acute finding in the visualized skeleton. IMPRESSION: No evidence of active disease. Electronically Signed   By: Emmaline Kluver M.D.   On: 03/07/2019 14:14   DG Chest Portable 1 View  Result Date: 02/27/2019 CLINICAL DATA:  Shortness of breath with hypoxia and congestion. Former smoker. EXAM:  PORTABLE CHEST 1 VIEW COMPARISON:  Radiographs 09/04/2018 FINDINGS: 1624 hours. The heart size and mediastinal contours are stable. There is a large hiatal hernia. The heart size is grossly normal. There is aortic atherosclerosis. There is possible mildly increased atelectasis in the left lower lobe medially. The lungs are otherwise clear. There is no pleural effusion, pneumothorax or edema. The bones appear unremarkable. Telemetry leads overlie the chest. IMPRESSION: No definite acute findings. Possible mildly increased left lower lobe atelectasis adjacent to a large hiatal hernia. Overall appearance is similar to previous study of 6 months ago. Electronically Signed   By: Carey Bullocks M.D.   On: 02/27/2019 17:01    Microbiology: Recent Results (from the past 240 hour(s))  Culture, blood (routine x 2)     Status: None (Preliminary result)   Collection Time: 03/08/19  5:43 AM   Specimen: BLOOD  Result Value Ref Range Status   Specimen Description BLOOD LEFT ARM  Final   Special Requests   Final    BOTTLES DRAWN AEROBIC ONLY Blood Culture adequate volume   Culture   Final    NO GROWTH 4 DAYS Performed at [facility], [address] [place], [place] [address]    Report Status PENDING  Incomplete  Culture, blood (routine x 2)     Status: None (Preliminary result)   Collection Time: 03/08/19  5:43 AM   Specimen: BLOOD  Result Value Ref Range Status   Specimen Description BLOOD RIGHT ARM  Final   Special Requests   Final    BOTTLES DRAWN AEROBIC AND ANAEROBIC Blood Culture adequate volume   Culture   Final    NO GROWTH 4 DAYS Performed at [facility], [address], [place], [place] [address]    Report Status PENDING  Incomplete     Labs: CBC: Recent Labs  Lab 03/07/19 1356 03/08/19 0543  WBC 12.8* 9.2  NEUTROABS 9.2*  --   HGB 14.1 12.8  HCT 42.3 38.9  MCV 94.0 95.3  PLT 351 296   Basic Metabolic Panel: Recent Labs  Lab 03/07/19 1356 03/10/19 0400  NA  134* 137  K 4.0 3.8  CL 93* 102  CO2 30 27  GLUCOSE 116* 112*  BUN 18 20  CREATININE 1.07* 0.67  CALCIUM 9.5 8.4*   Liver Function Tests: Recent Labs  Lab 03/07/19 1356  AST 25  ALT 35  ALKPHOS 48  BILITOT 1.2  PROT 7.2  ALBUMIN 3.7   No results for input(s): LIPASE, AMYLASE in the last 168 hours. No results for input(s): AMMONIA in the last 168 hours. Cardiac Enzymes: No results for input(s): CKTOTAL, CKMB, CKMBINDEX, TROPONINI in the last 168 hours. BNP (last 3 results) Recent Labs    02/27/19 1616  BNP 57.5   CBG: No results for input(s): GLUCAP in the last 168 hours.  Time spent: 35 minutes  Signed:  Lynden Oxford  [facility] 03/10/2019 7:56 PM

## 2019-03-13 LAB — CULTURE, BLOOD (ROUTINE X 2)
Culture: NO GROWTH
Culture: NO GROWTH
Special Requests: ADEQUATE
Special Requests: ADEQUATE

## 2019-04-03 DIAGNOSIS — J432 Centrilobular emphysema: Secondary | ICD-10-CM | POA: Diagnosis present

## 2019-04-03 DIAGNOSIS — J9611 Chronic respiratory failure with hypoxia: Secondary | ICD-10-CM | POA: Diagnosis present

## 2020-02-18 DIAGNOSIS — N1831 Chronic kidney disease, stage 3a: Secondary | ICD-10-CM | POA: Diagnosis present

## 2021-02-13 IMAGING — DX DG CHEST 1V PORT
1 series · 1 of 1 positions shown · non-contrast
Comparison: Chest radiograph 02/27/2019

CLINICAL DATA: Patient to ER for weakness and "upset stomach".
Reports this began last night. Denies N/V/D, fever, cough, chills,
shortness of breath-recent admission to [facility].

EXAM:
PORTABLE CHEST 1 VIEW

[chest ap]
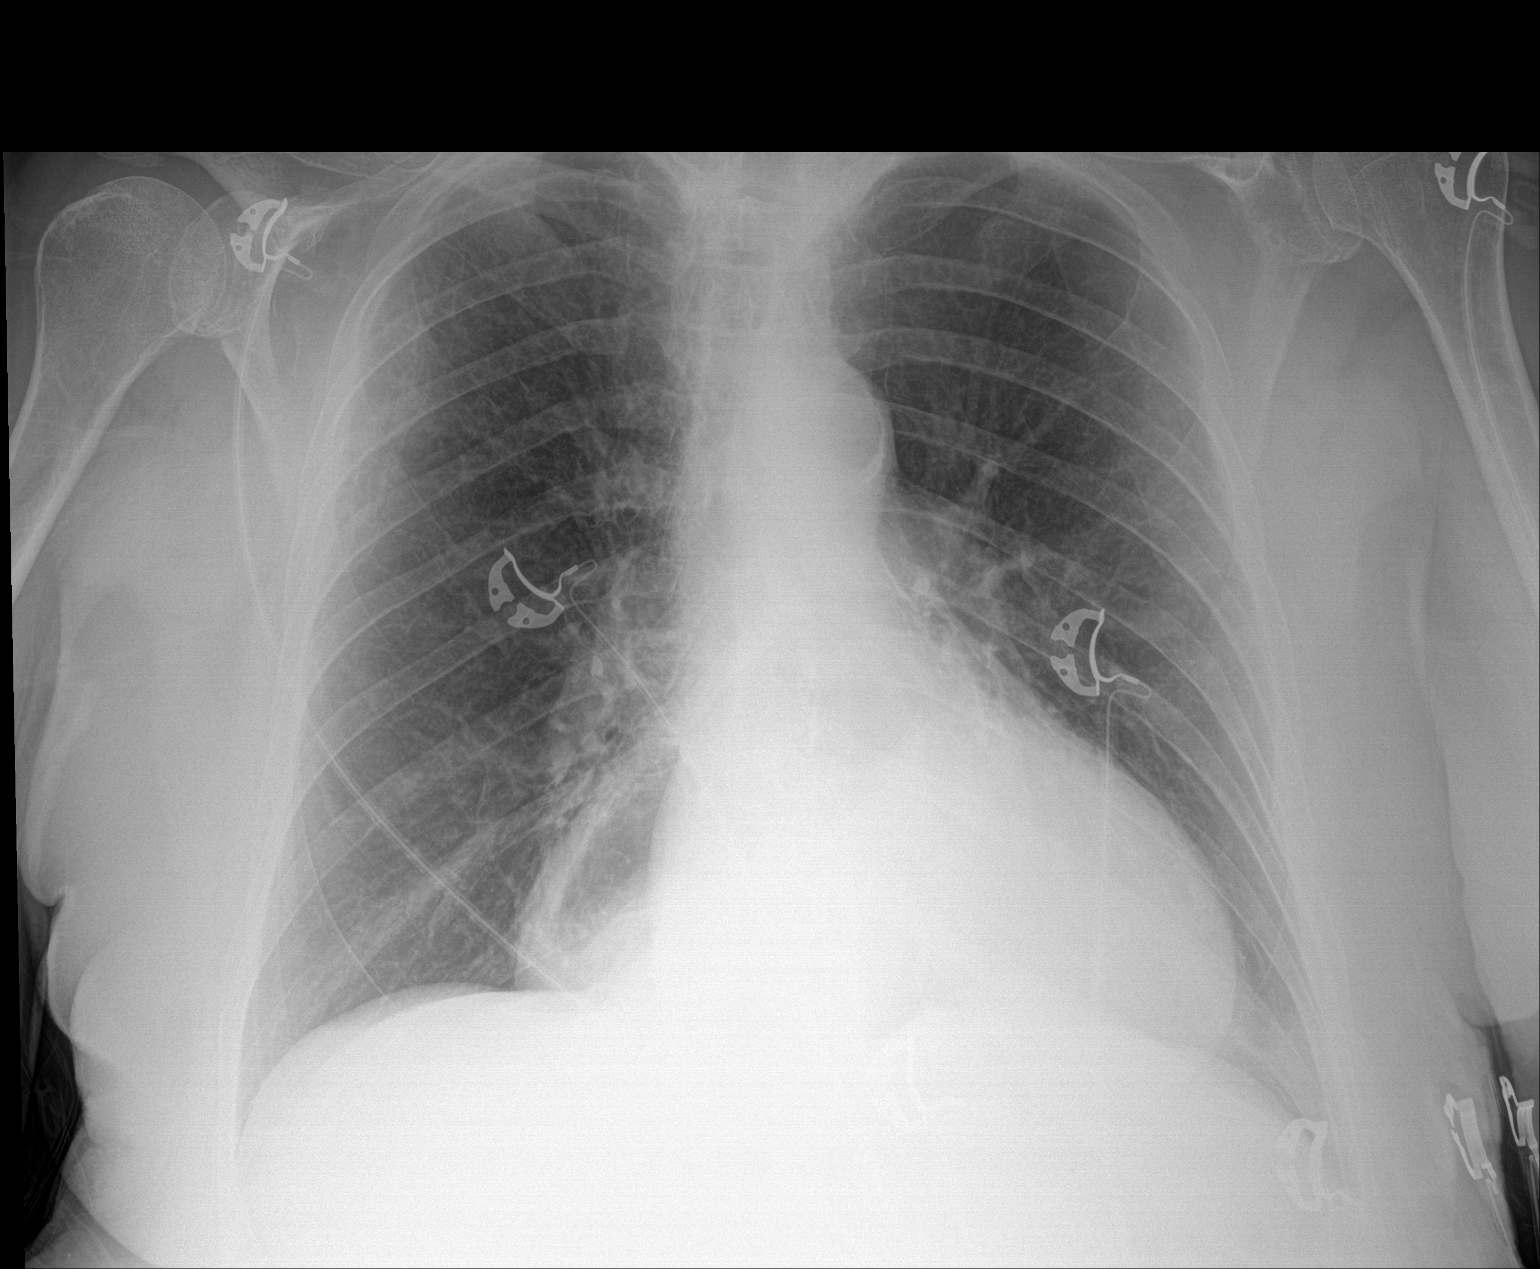

[1 of 1 positions shown; findings below may reference images not displayed]

FINDINGS: Stable cardiomediastinal contours. Large hiatal hernia. The lungs
are clear. No pneumothorax or large pleural effusion. No acute
finding in the visualized skeleton.
IMPRESSION: No evidence of active disease.

## 2021-02-14 IMAGING — CT CT ANGIO CHEST
2 of 6 series · 18 of 46 positions shown · IV contrast (omnipaque)
Comparison: None.

CLINICAL DATA: Elevated D-dimer. Hypoxic respiratory failure in
patient with [id number] infection. Evaluate for pulmonary embolism.

EXAM:
CT ANGIOGRAPHY CHEST WITH CONTRAST
TECHNIQUE: Multidetector CT imaging of the chest was performed using the
standard protocol during bolus administration of intravenous
contrast. Multiplanar CT image reconstructions and MIPs were
obtained to evaluate the vascular anatomy.
CONTRAST:  80mL OMNIPAQUE IOHEXOL 350 MG/ML SOLN

[Series 5: thins · axial · 0.63mm/px · z∈[-322,-69]mm · 16 of 279 slices shown]
[im 13/279  lung]
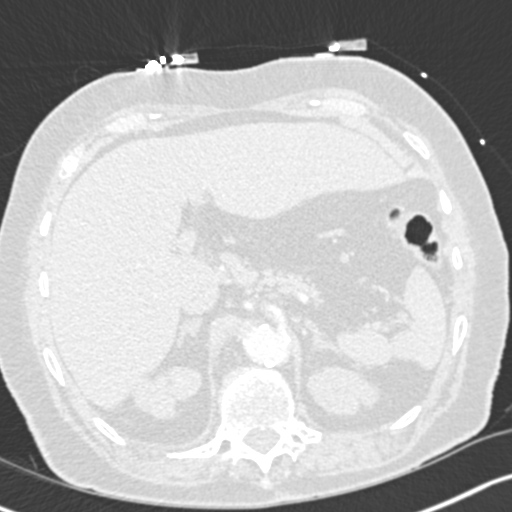
[im 37/279  soft-tissue]
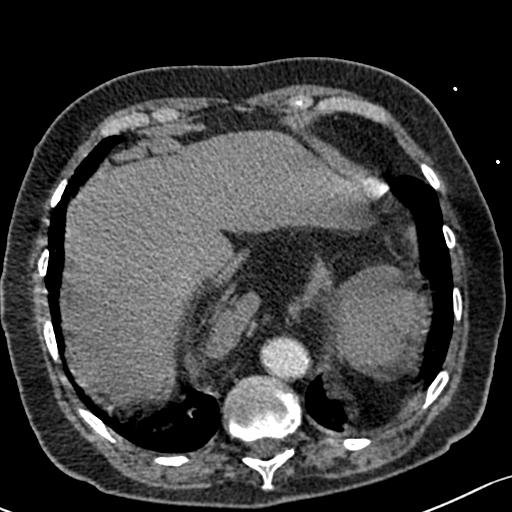
[im 49/279  lung]
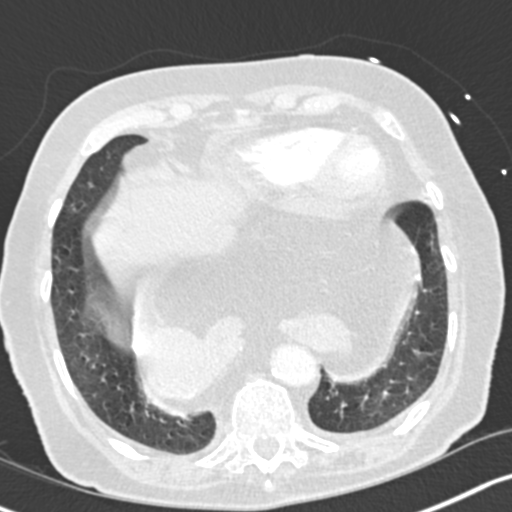
[im 61/279  soft-tissue]
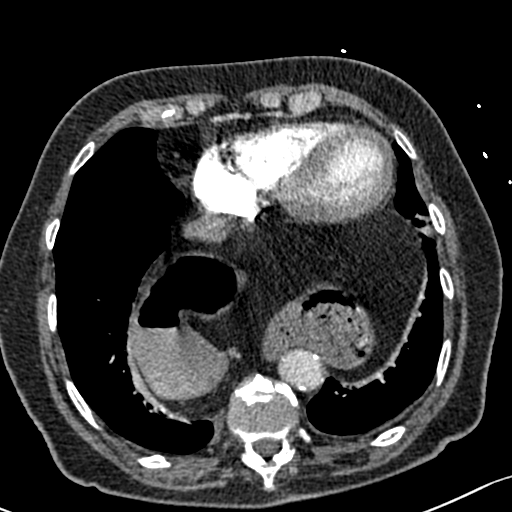
[im 85/279  lung]
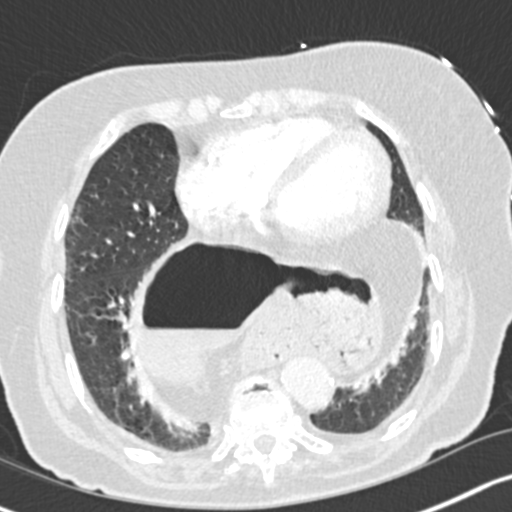
[im 97/279  soft-tissue]
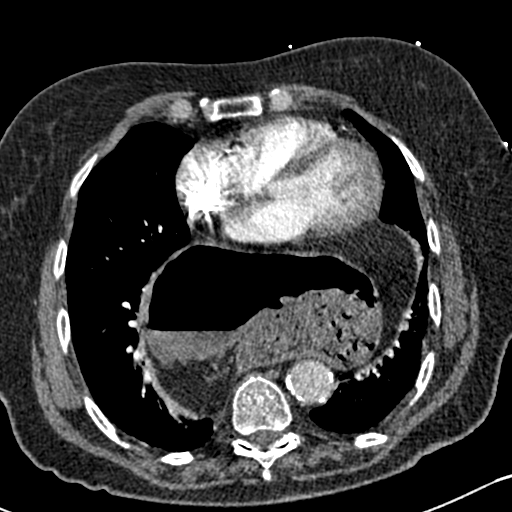
[im 109/279  lung]
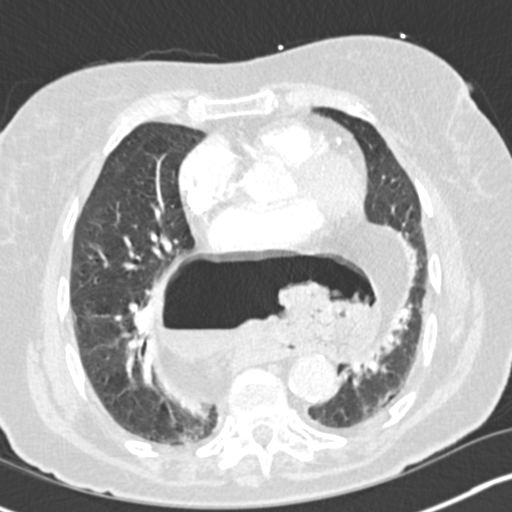
[im 133/279  soft-tissue]
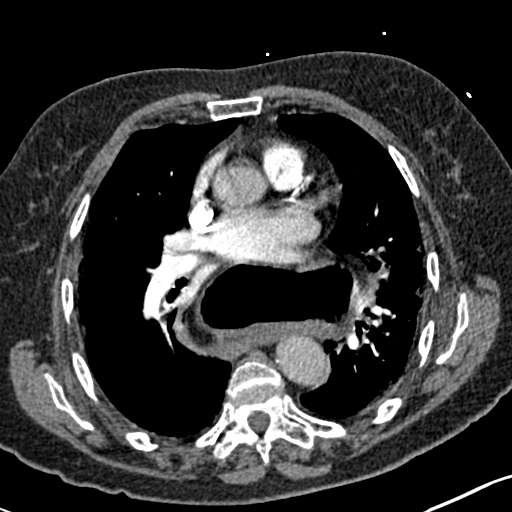
[im 146/279  lung]
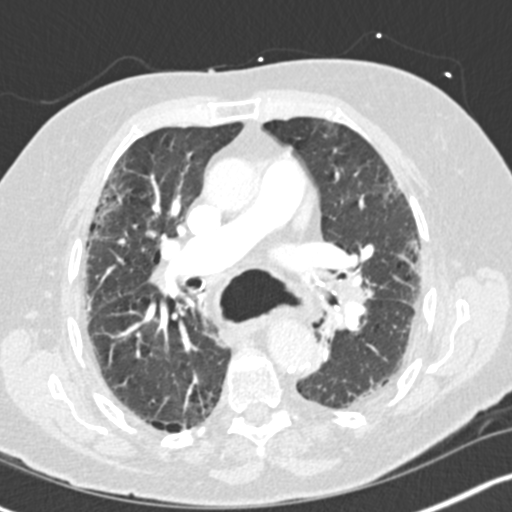
[im 170/279  soft-tissue]
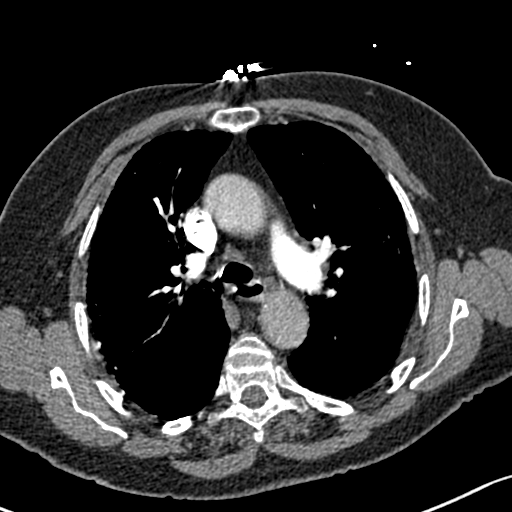
[im 182/279  lung]
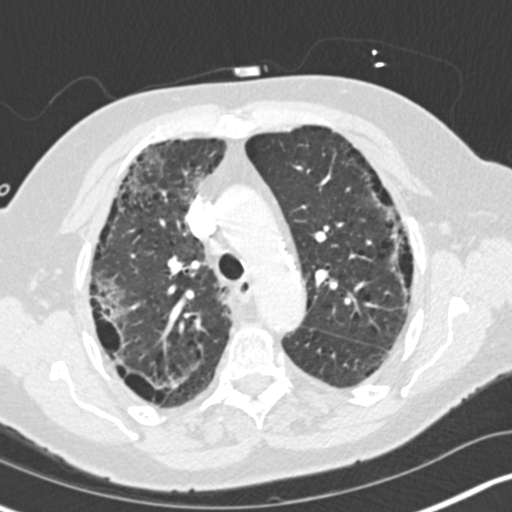
[im 194/279  soft-tissue]
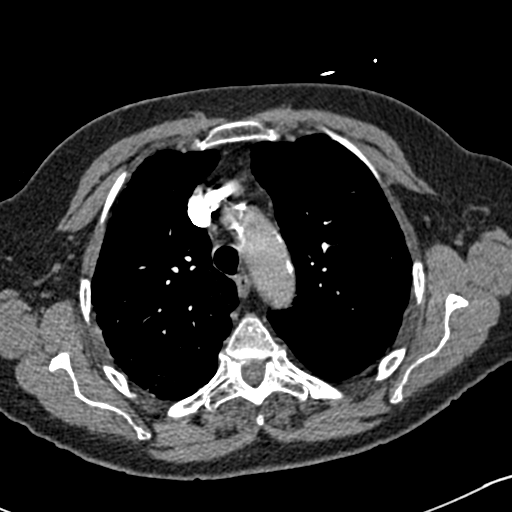
[im 218/279  lung]
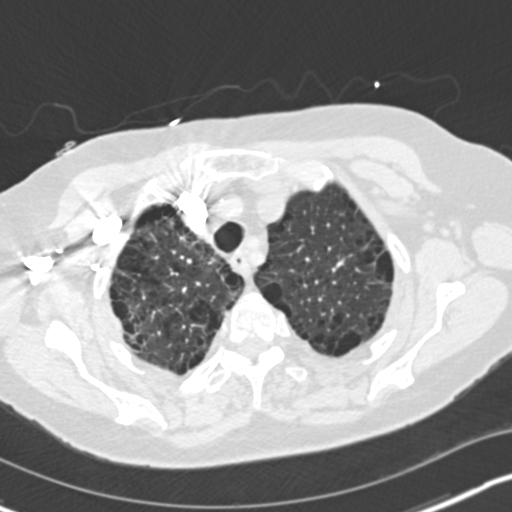
[im 230/279  soft-tissue]
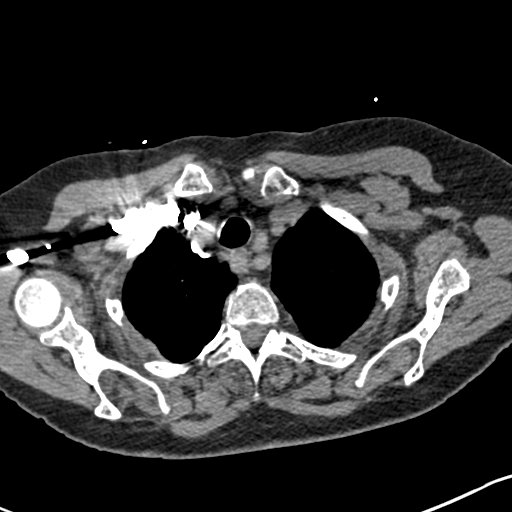
[im 242/279  lung]
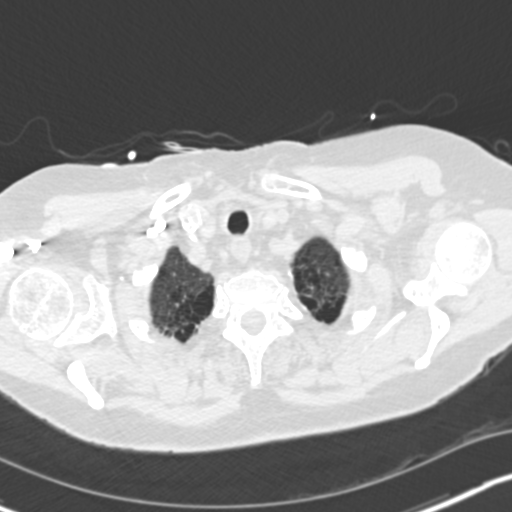
[im 266/279  soft-tissue]
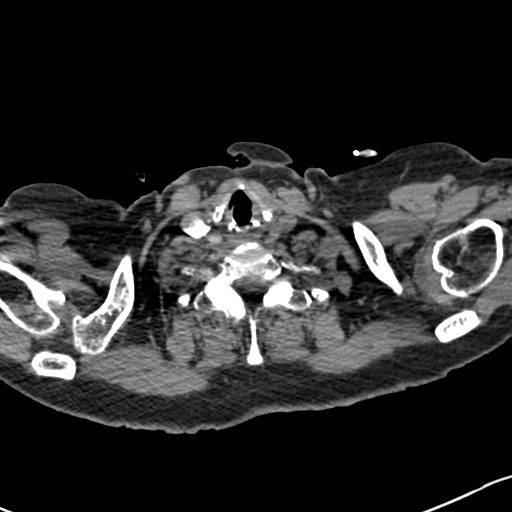

[Series 7: coronal mpr · coronal · 0.57mm/px · 2 of 106 slices shown]
[im 36/106  soft-tissue]
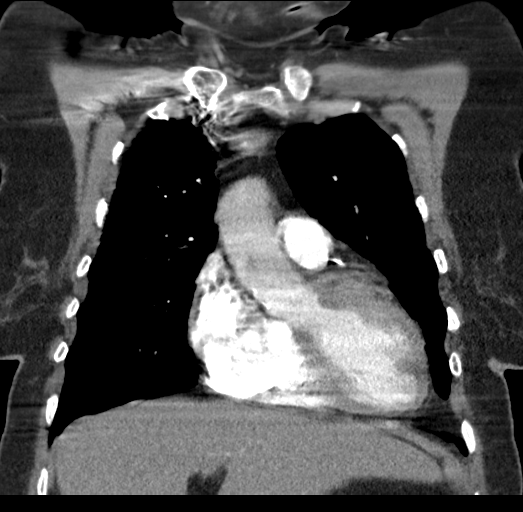
[im 71/106  soft-tissue]
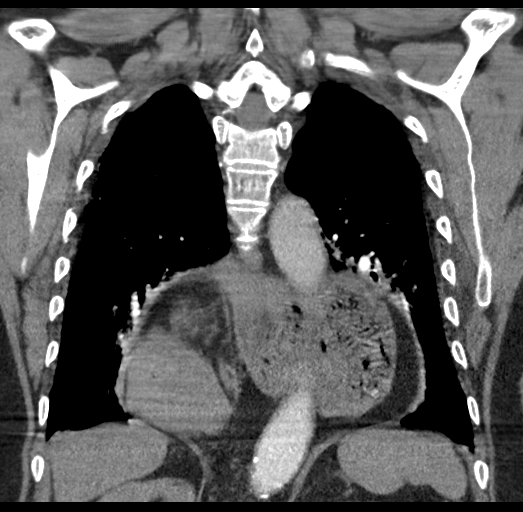

[18 of 46 positions shown; findings below may reference images not displayed]

FINDINGS: Examination is degraded due to patient respiratory artifact.

Vascular Findings:

There is adequate opacification of the pulmonary arterial system
with the main pulmonary artery measuring 288 Hounsfield units. There
are no discrete filling defects within the pulmonary arterial tree
to suggest pulmonary embolism. Normal caliber of the main pulmonary
artery.

Borderline cardiomegaly.  Coronary artery calcifications.

Scattered atherosclerotic plaque within a normal caliber thoracic
aorta. The left vertebral artery is incidentally noted to arise
directly from the aortic arch.

Review of the MIP images confirms the above findings.

----------------------------------------------------------------------------------

Nonvascular Findings:

Mediastinum/Lymph Nodes: Scattered mediastinal and bilateral hilar
lymph nodes not enlarged by size criteria. No bulky mediastinal,
hilar or axillary lymphadenopathy.

Lungs/Pleura: Severe mixed centrilobular and paraseptal
emphysematous change, most conspicuous within the lung apices and
peripheral aspects of the bilateral upper lobes. Subsegmental
atelectasis adjacent to large hiatal hernia which contains the
entirety of the stomach. The central pulmonary airways appear
patent. No discrete pulmonary nodules given limitation of the
examination.

Upper abdomen: As above, there is a large hiatal hernia which
contains the entirety of the stomach, without evidence of gastric
volvulus.

Musculoskeletal: No acute or aggressive osseous abnormalities.
Regional soft tissues appear normal.
IMPRESSION: 1. No evidence of pulmonary embolism.
2. Severe mixed centrilobular and paraseptal emphysematous change.
Emphysema ([id number]).
3. No focal airspace opacities or parenchymal evidence of atypical
infection/COVID 19 infection.
4. Large hiatal hernia containing the entirety of the stomach
without evidence of gastric volvulus.
5. Coronary artery calcifications. Aortic Atherosclerosis
([id number]).

## 2021-02-26 ENCOUNTER — Emergency Department (HOSPITAL_BASED_OUTPATIENT_CLINIC_OR_DEPARTMENT_OTHER)
Admission: EM | Admit: 2021-02-26 | Discharge: 2021-02-26 | Disposition: A | Discharge status: Home / Self Care | Payer: Medicare HMO | Source: Home / Self Care | Attending: Emergency Medicine | Admitting: Emergency Medicine

## 2021-02-26 ENCOUNTER — Encounter (HOSPITAL_BASED_OUTPATIENT_CLINIC_OR_DEPARTMENT_OTHER): Payer: Self-pay | Admitting: Urology

## 2021-02-26 ENCOUNTER — Emergency Department (HOSPITAL_BASED_OUTPATIENT_CLINIC_OR_DEPARTMENT_OTHER): Payer: Medicare HMO

## 2021-02-26 DIAGNOSIS — S0990XA Unspecified injury of head, initial encounter: Secondary | ICD-10-CM

## 2021-02-26 DIAGNOSIS — Z87891 Personal history of nicotine dependence: Secondary | ICD-10-CM | POA: Insufficient documentation

## 2021-02-26 DIAGNOSIS — R52 Pain, unspecified: Secondary | ICD-10-CM | POA: Diagnosis not present

## 2021-02-26 DIAGNOSIS — W01198A Fall on same level from slipping, tripping and stumbling with subsequent striking against other object, initial encounter: Secondary | ICD-10-CM | POA: Insufficient documentation

## 2021-02-26 DIAGNOSIS — S0101XA Laceration without foreign body of scalp, initial encounter: Secondary | ICD-10-CM | POA: Insufficient documentation

## 2021-02-26 DIAGNOSIS — S72451A Displaced supracondylar fracture without intracondylar extension of lower end of right femur, initial encounter for closed fracture: Secondary | ICD-10-CM | POA: Diagnosis not present

## 2021-02-26 DIAGNOSIS — R7309 Other abnormal glucose: Secondary | ICD-10-CM | POA: Insufficient documentation

## 2021-02-26 LAB — CBC
HCT: 41.2 % (ref 36.0–46.0)
Hemoglobin: 13.7 g/dL (ref 12.0–15.0)
MCH: 31.6 pg (ref 26.0–34.0)
MCHC: 33.3 g/dL (ref 30.0–36.0)
MCV: 94.9 fL (ref 80.0–100.0)
Platelets: 271 10*3/uL (ref 150–400)
RBC: 4.34 MIL/uL (ref 3.87–5.11)
RDW: 12.3 % (ref 11.5–15.5)
WBC: 13 10*3/uL — ABNORMAL HIGH (ref 4.0–10.5)
nRBC: 0 % (ref 0.0–0.2)

## 2021-02-26 LAB — BASIC METABOLIC PANEL
Anion gap: 10 (ref 5–15)
BUN: 15 mg/dL (ref 8–23)
CO2: 26 mmol/L (ref 22–32)
Calcium: 8.9 mg/dL (ref 8.9–10.3)
Chloride: 98 mmol/L (ref 98–111)
Creatinine, Ser: 0.82 mg/dL (ref 0.44–1.00)
GFR, Estimated: >60 mL/min (ref >60)
Glucose, Bld: 128 mg/dL — ABNORMAL HIGH (ref 70–99)
Potassium: 3.5 mmol/L (ref 3.5–5.1)
Sodium: 134 mmol/L — ABNORMAL LOW (ref 135–145)

## 2021-02-26 LAB — CBG MONITORING, ED: Glucose-Capillary: 148 mg/dL — ABNORMAL HIGH (ref 70–99)

## 2021-02-26 MED ORDER — LIDOCAINE-EPINEPHRINE (PF) 2 %-1:200000 IJ SOLN
INTRAMUSCULAR | Status: AC
  Filled 2021-02-26: qty 20

## 2021-02-26 MED ORDER — ACETAMINOPHEN 325 MG PO TABS
650.0000 mg | ORAL_TABLET | Freq: Once | ORAL | Status: AC
  Administered 2021-02-26: 14:00:00 650 mg via ORAL
  Filled 2021-02-26: qty 2

## 2021-02-26 MED ORDER — LIDOCAINE-EPINEPHRINE (PF) 2 %-1:200000 IJ SOLN
10.0000 mL | Freq: Once | INTRAMUSCULAR | Status: AC
  Administered 2021-02-26: 10 mL
  Filled 2021-02-26: qty 20

## 2021-02-26 NOTE — ED Notes (Signed)
Per daughter pt lives at independent living, worker states she was not acting herself this morning prior to getting hair appointment.  Was acting more confused than normal.

## 2021-02-26 NOTE — ED Notes (Signed)
Hematoma to posterior scalp noted with small puncture wound, bleeding controlled.

## 2021-02-26 NOTE — ED Provider Notes (Signed)
[facility] EMERGENCY DEPARTMENT Provider Note  CSN: [id number] Arrival date & time: 02/26/21 1250    History Chief Complaint  Patient presents with   Fall   Head Laceration    Colleen Garrison is a 87 y.o. female report she was walking from her car to her apartment at [facility] when she fell backwards hitting her hit on the ground. She denies LOC to me. Deneis pain to me, but has reported headache to family at bedside and to RN. She denies any CP, SOB, Fever, N/V/D. No other injuries.    Past Medical History:  Diagnosis Date   Depression    Hiatal hernia    Memory loss    Thyroid disease     History reviewed. No pertinent surgical history.  History reviewed. No pertinent family history.  Social History   Tobacco Use   Smoking status: Former   Smokeless tobacco: Never  Building services engineer Use: Never used  Substance Use Topics   Alcohol use: No   Drug use: Never     Home Medications Prior to Admission medications   Medication Sig Start Date End Date Taking? Authorizing Provider  albuterol (VENTOLIN HFA) 108 (90 Base) MCG/ACT inhaler Inhale 2 puffs into the lungs every 6 (six) hours as needed for wheezing. 09/06/18   [provider]  Ascorbic Acid (VITAMIN C PO) Take 1 tablet by mouth daily.    [provider]  ascorbic acid (VITAMIN C) 500 MG tablet Take 1 tablet (500 mg total) by mouth daily. 03/11/19   Rolly Salter, MD  b complex vitamins capsule Take 1 capsule by mouth daily.     [provider]  Cholecalciferol 25 MCG (1000 UT) tablet Take 1,000 Units by mouth daily.     [provider]  donepezil (ARICEPT) 5 MG tablet Take 5 mg by mouth at bedtime. 01/09/19   [provider]  escitalopram (LEXAPRO) 10 MG tablet Take 10 mg by mouth daily.  08/12/16   [provider]  estradiol (ESTRACE) 0.1 MG/GM vaginal cream Place 1 Applicatorful vaginally at bedtime as needed (itching).  03/17/17    [provider]  levothyroxine (SYNTHROID) 100 MCG tablet Take 100 mcg by mouth daily before breakfast.    [provider]  LORazepam (ATIVAN) 0.5 MG tablet Take 0.5 mg by mouth at bedtime.  07/04/18   [provider]  Multiple Vitamin (MULTIVITAMIN) capsule Take 1 capsule by mouth daily.    [provider]  Omega-3 Fatty Acids (OMEGA-3 FISH OIL PO) Take 1 capsule by mouth daily.     [provider]  vitamin E 100 UNIT capsule Take 100 Units by mouth daily.    [provider]  zinc sulfate 220 (50 Zn) MG capsule Take 1 capsule (220 mg total) by mouth daily. 03/11/19   Rolly Salter, MD     Allergies    Codeine   Review of Systems   Review of Systems A comprehensive review of systems was completed and negative except as noted in HPI.    Physical Exam BP (!) 141/51    Pulse 63    Temp 98.2 F (36.8 C) (Oral)    Resp 18    Ht 5\' 3"  (1.6 m)    Wt 89.9 kg    SpO2 90%    BMI 35.13 kg/m   Physical Exam Vitals and nursing note reviewed.  Constitutional:      Appearance: Normal appearance.  HENT:  Head: Normocephalic.     Comments: Wound to R occipital scalp, will reassess after cleaning.     Nose: Nose normal.     Mouth/Throat:     Mouth: Mucous membranes are moist.  Eyes:     Extraocular Movements: Extraocular movements intact.     Conjunctiva/sclera: Conjunctivae normal.  Cardiovascular:     Rate and Rhythm: Normal rate.  Pulmonary:     Effort: Pulmonary effort is normal.     Breath sounds: Normal breath sounds.  Abdominal:     General: Abdomen is flat.     Palpations: Abdomen is soft.     Tenderness: There is no abdominal tenderness.  Musculoskeletal:        General: No swelling, tenderness or signs of injury. Normal range of motion.     Cervical back: Neck supple. No tenderness.  Skin:    General: Skin is warm and dry.  Neurological:     General: No focal deficit present.     Mental Status: She is alert.   Psychiatric:        Mood and Affect: Mood normal.     ED Results / Procedures / Treatments   Labs (all labs ordered are listed, but only abnormal results are displayed) Labs Reviewed  BASIC METABOLIC PANEL - Abnormal; Notable for the following components:      Result Value   Sodium 134 (*)    Glucose, Bld 128 (*)    All other components within normal limits  CBC - Abnormal; Notable for the following components:   WBC 13.0 (*)    All other components within normal limits  CBG MONITORING, ED - Abnormal; Notable for the following components:   Glucose-Capillary 148 (*)    All other components within normal limits    EKG EKG Interpretation  Date/Time:  Friday February 26 2021 13:06:14 EST Ventricular Rate:  68 PR Interval:  180 QRS Duration: 87 QT Interval:  415 QTC Calculation: 442 R Axis:   84 Text Interpretation: Sinus rhythm Borderline right axis deviation No significant change since last tracing Confirmed by Susy Frizzle [phone]) on 02/26/2021 1:59:27 PM   Radiology CT Head Wo Contrast  Result Date: 02/26/2021 CLINICAL DATA:  Fall, head injury EXAM: CT HEAD WITHOUT CONTRAST CT CERVICAL SPINE WITHOUT CONTRAST TECHNIQUE: Multidetector CT imaging of the head and cervical spine was performed following the standard protocol without intravenous contrast. Multiplanar CT image reconstructions of the cervical spine were also generated. COMPARISON:  CT head 03/07/2019 FINDINGS: CT HEAD FINDINGS Brain: Mild atrophy. Patchy white matter hypodensity bilaterally unchanged compatible with chronic microvascular ischemia. Negative for acute infarct, hemorrhage, mass. Vascular: Negative for hyperdense vessel Skull: Negative Sinuses/Orbits: Paranasal sinuses clear. Bilateral cataract surgery. Other: Posterior convexity scalp laceration and scalp hematoma in the midline. Gas in the subcutaneous tissues. CT CERVICAL SPINE FINDINGS Alignment: Anterolisthesis C7-T1.  Otherwise normal alignment  Skull base and vertebrae: Negative for fracture Soft tissues and spinal canal: No soft tissue mass or edema. Disc levels: Disc degeneration and spurring most prominent C4-5 and C5-6 causing foraminal narrowing bilaterally. Upper chest: Apical scarring bilaterally with emphysema. Other: None IMPRESSION: 1. No acute intracranial abnormality 2. Posterior scalp laceration and hematoma 3. Negative for cervical spine fracture. Electronically Signed   By: Marlan Palau M.D.   On: 02/26/2021 14:14   CT Cervical Spine Wo Contrast  Result Date: 02/26/2021 CLINICAL DATA:  Fall, head injury EXAM: CT HEAD WITHOUT CONTRAST CT CERVICAL SPINE WITHOUT CONTRAST TECHNIQUE: Multidetector CT imaging of the  head and cervical spine was performed following the standard protocol without intravenous contrast. Multiplanar CT image reconstructions of the cervical spine were also generated. COMPARISON:  CT head 03/07/2019 FINDINGS: CT HEAD FINDINGS Brain: Mild atrophy. Patchy white matter hypodensity bilaterally unchanged compatible with chronic microvascular ischemia. Negative for acute infarct, hemorrhage, mass. Vascular: Negative for hyperdense vessel Skull: Negative Sinuses/Orbits: Paranasal sinuses clear. Bilateral cataract surgery. Other: Posterior convexity scalp laceration and scalp hematoma in the midline. Gas in the subcutaneous tissues. CT CERVICAL SPINE FINDINGS Alignment: Anterolisthesis C7-T1.  Otherwise normal alignment Skull base and vertebrae: Negative for fracture Soft tissues and spinal canal: No soft tissue mass or edema. Disc levels: Disc degeneration and spurring most prominent C4-5 and C5-6 causing foraminal narrowing bilaterally. Upper chest: Apical scarring bilaterally with emphysema. Other: None IMPRESSION: 1. No acute intracranial abnormality 2. Posterior scalp laceration and hematoma 3. Negative for cervical spine fracture. Electronically Signed   By: Marlan Palau M.D.   On: 02/26/2021 14:14     Procedures .Marland KitchenLaceration Repair  Date/Time: 02/26/2021 2:47 PM Performed by: Pollyann Savoy, MD Authorized by: Pollyann Savoy, MD   Consent:    Consent obtained:  Verbal   Consent given by:  Patient Anesthesia:    Anesthesia method:  Local infiltration   Local anesthetic:  Lidocaine 2% WITH epi Laceration details:    Location:  Scalp   Scalp location:  R parietal   Length (cm):  2.5 Pre-procedure details:    Preparation:  Patient was prepped and draped in usual sterile fashion Treatment:    Area cleansed with:  Saline and Shur-Clens   Amount of cleaning:  Standard   Irrigation solution:  Sterile saline   Irrigation method:  Syringe Skin repair:    Repair method:  Staples   Number of staples:  2 Approximation:    Approximation:  Close Repair type:    Repair type:  Simple Post-procedure details:    Dressing:  Open (no dressing)  Medications Ordered in the ED Medications  acetaminophen (TYLENOL) tablet 650 mg (650 mg Oral Given 02/26/21 1408)  lidocaine-EPINEPHrine (XYLOCAINE W/EPI) 2 %-1:200000 (PF) injection 10 mL (10 mLs Infiltration Given by Other 02/26/21 1446)     MDM Rules/Calculators/A&P MDM Patient with a fall earlier today, not witnessed but does not seem to have been syncopal. Will check basic labs, CT head, EKG. Reassess wound after cleaning to see if it is in need of repair.   ED Course  I have reviewed the triage vital signs and the nursing notes.  Pertinent labs & imaging results that were available during my care of the patient were reviewed by me and considered in my medical decision making (see chart for details).  Clinical Course as of 02/26/21 1453  Fri Feb 26, 2021  1429 CBC with mild leukocytosis of unclear significance. No infectious symptoms. BMP is normal.  [CS]  1430 CT results reviewed, no ICH or spine fracture. There is a 2.5cm wound that will need repair.  [CS]  1448 Wound repaired. Wound care instructions given to patient and  family at bedside who will be with her for the next 24-48hours. Head injury precautions, RTED for any other concerns.  [CS]    Clinical Course User Index [CS] Pollyann Savoy, MD    Final Clinical Impression(s) / ED Diagnoses Final diagnoses:  Injury of head, initial encounter  Laceration of scalp, initial encounter    Rx / DC Orders ED Discharge Orders     None  Pollyann Savoy, MD 02/26/21 [phone]

## 2021-02-26 NOTE — ED Notes (Signed)
Discharge instructions discussed with pt and family. Pt verbalized understanding. Pt stable and ambulatory.

## 2021-02-26 NOTE — ED Triage Notes (Signed)
Pt states fall around 1100, posterior scalp laceration noted, pt denies being on blood thinner, states unknown LOC. Fall unwitnessed.

## 2021-02-26 NOTE — ED Notes (Signed)
Wound cleaned with wound cleanser.

## 2021-02-28 ENCOUNTER — Other Ambulatory Visit: Payer: Self-pay

## 2021-02-28 ENCOUNTER — Encounter (HOSPITAL_COMMUNITY)
Admission: EM | Disposition: A | Discharge status: Home / Self Care | Payer: Self-pay | Source: Home / Self Care | Attending: Internal Medicine

## 2021-02-28 ENCOUNTER — Emergency Department (HOSPITAL_COMMUNITY): Payer: Medicare HMO

## 2021-02-28 ENCOUNTER — Inpatient Hospital Stay (HOSPITAL_COMMUNITY): Payer: Medicare HMO | Admitting: Anesthesiology

## 2021-02-28 ENCOUNTER — Inpatient Hospital Stay (HOSPITAL_COMMUNITY)
Admission: EM | Admit: 2021-02-28 | Discharge: 2021-03-04 | DRG: 481 | Disposition: A | Discharge status: Skilled Nursing Facility | Payer: Medicare HMO | Source: Skilled Nursing Facility | Attending: Internal Medicine | Admitting: Internal Medicine

## 2021-02-28 ENCOUNTER — Inpatient Hospital Stay (HOSPITAL_COMMUNITY): Payer: Medicare HMO

## 2021-02-28 ENCOUNTER — Encounter (HOSPITAL_COMMUNITY): Payer: Self-pay

## 2021-02-28 DIAGNOSIS — G301 Alzheimer's disease with late onset: Secondary | ICD-10-CM | POA: Diagnosis present

## 2021-02-28 DIAGNOSIS — D62 Acute posthemorrhagic anemia: Secondary | ICD-10-CM | POA: Diagnosis not present

## 2021-02-28 DIAGNOSIS — Z79899 Other long term (current) drug therapy: Secondary | ICD-10-CM

## 2021-02-28 DIAGNOSIS — S0101XA Laceration without foreign body of scalp, initial encounter: Secondary | ICD-10-CM | POA: Diagnosis present

## 2021-02-28 DIAGNOSIS — E876 Hypokalemia: Secondary | ICD-10-CM | POA: Diagnosis present

## 2021-02-28 DIAGNOSIS — M6282 Rhabdomyolysis: Secondary | ICD-10-CM | POA: Diagnosis present

## 2021-02-28 DIAGNOSIS — N179 Acute kidney failure, unspecified: Secondary | ICD-10-CM | POA: Diagnosis present

## 2021-02-28 DIAGNOSIS — Z8616 Personal history of COVID-19: Secondary | ICD-10-CM | POA: Diagnosis not present

## 2021-02-28 DIAGNOSIS — Z79891 Long term (current) use of opiate analgesic: Secondary | ICD-10-CM

## 2021-02-28 DIAGNOSIS — E663 Overweight: Secondary | ICD-10-CM | POA: Diagnosis present

## 2021-02-28 DIAGNOSIS — J9611 Chronic respiratory failure with hypoxia: Secondary | ICD-10-CM | POA: Diagnosis present

## 2021-02-28 DIAGNOSIS — S72401A Unspecified fracture of lower end of right femur, initial encounter for closed fracture: Secondary | ICD-10-CM | POA: Diagnosis not present

## 2021-02-28 DIAGNOSIS — Z66 Do not resuscitate: Secondary | ICD-10-CM | POA: Diagnosis present

## 2021-02-28 DIAGNOSIS — F02B0 Dementia in other diseases classified elsewhere, moderate, without behavioral disturbance, psychotic disturbance, mood disturbance, and anxiety: Secondary | ICD-10-CM | POA: Diagnosis not present

## 2021-02-28 DIAGNOSIS — Z9889 Other specified postprocedural states: Secondary | ICD-10-CM

## 2021-02-28 DIAGNOSIS — S72451A Displaced supracondylar fracture without intracondylar extension of lower end of right femur, initial encounter for closed fracture: Principal | ICD-10-CM | POA: Diagnosis present

## 2021-02-28 DIAGNOSIS — F32A Depression, unspecified: Secondary | ICD-10-CM | POA: Diagnosis present

## 2021-02-28 DIAGNOSIS — W19XXXA Unspecified fall, initial encounter: Secondary | ICD-10-CM

## 2021-02-28 DIAGNOSIS — Z7951 Long term (current) use of inhaled steroids: Secondary | ICD-10-CM

## 2021-02-28 DIAGNOSIS — F02A18 Dementia in other diseases classified elsewhere, mild, with other behavioral disturbance: Secondary | ICD-10-CM | POA: Diagnosis present

## 2021-02-28 DIAGNOSIS — Z419 Encounter for procedure for purposes other than remedying health state, unspecified: Secondary | ICD-10-CM

## 2021-02-28 DIAGNOSIS — J984 Other disorders of lung: Secondary | ICD-10-CM | POA: Diagnosis present

## 2021-02-28 DIAGNOSIS — E039 Hypothyroidism, unspecified: Secondary | ICD-10-CM | POA: Diagnosis present

## 2021-02-28 DIAGNOSIS — W06XXXA Fall from bed, initial encounter: Secondary | ICD-10-CM | POA: Diagnosis present

## 2021-02-28 DIAGNOSIS — N1831 Chronic kidney disease, stage 3a: Secondary | ICD-10-CM | POA: Diagnosis present

## 2021-02-28 DIAGNOSIS — Z79890 Hormone replacement therapy (postmenopausal): Secondary | ICD-10-CM | POA: Diagnosis not present

## 2021-02-28 DIAGNOSIS — Z20822 Contact with and (suspected) exposure to covid-19: Secondary | ICD-10-CM | POA: Diagnosis present

## 2021-02-28 DIAGNOSIS — Z6826 Body mass index (BMI) 26.0-26.9, adult: Secondary | ICD-10-CM

## 2021-02-28 DIAGNOSIS — T796XXA Traumatic ischemia of muscle, initial encounter: Secondary | ICD-10-CM | POA: Diagnosis not present

## 2021-02-28 DIAGNOSIS — R9431 Abnormal electrocardiogram [ECG] [EKG]: Secondary | ICD-10-CM | POA: Diagnosis present

## 2021-02-28 DIAGNOSIS — G4700 Insomnia, unspecified: Secondary | ICD-10-CM | POA: Diagnosis present

## 2021-02-28 DIAGNOSIS — J432 Centrilobular emphysema: Secondary | ICD-10-CM | POA: Diagnosis present

## 2021-02-28 DIAGNOSIS — Z9981 Dependence on supplemental oxygen: Secondary | ICD-10-CM

## 2021-02-28 DIAGNOSIS — Z87891 Personal history of nicotine dependence: Secondary | ICD-10-CM | POA: Diagnosis not present

## 2021-02-28 DIAGNOSIS — D72829 Elevated white blood cell count, unspecified: Secondary | ICD-10-CM | POA: Diagnosis present

## 2021-02-28 DIAGNOSIS — F0280 Dementia in other diseases classified elsewhere without behavioral disturbance: Secondary | ICD-10-CM | POA: Diagnosis present

## 2021-02-28 DIAGNOSIS — R52 Pain, unspecified: Secondary | ICD-10-CM

## 2021-02-28 DIAGNOSIS — Z885 Allergy status to narcotic agent status: Secondary | ICD-10-CM

## 2021-02-28 HISTORY — PX: FEMUR IM NAIL: SHX1597

## 2021-02-28 LAB — BASIC METABOLIC PANEL
Anion gap: 11 (ref 5–15)
BUN: 23 mg/dL (ref 8–23)
CO2: 25 mmol/L (ref 22–32)
Calcium: 8.6 mg/dL — ABNORMAL LOW (ref 8.9–10.3)
Chloride: 100 mmol/L (ref 98–111)
Creatinine, Ser: 1.5 mg/dL — ABNORMAL HIGH (ref 0.44–1.00)
GFR, Estimated: 34 mL/min — ABNORMAL LOW (ref >60)
Glucose, Bld: 139 mg/dL — ABNORMAL HIGH (ref 70–99)
Potassium: 3.3 mmol/L — ABNORMAL LOW (ref 3.5–5.1)
Sodium: 136 mmol/L (ref 135–145)

## 2021-02-28 LAB — CBC WITH DIFFERENTIAL/PLATELET
Abs Immature Granulocytes: 0.21 10*3/uL — ABNORMAL HIGH (ref 0.00–0.07)
Basophils Absolute: 0.1 10*3/uL (ref 0.0–0.1)
Basophils Relative: 0 %
Eosinophils Absolute: 0.1 10*3/uL (ref 0.0–0.5)
Eosinophils Relative: 1 %
HCT: 37.8 % (ref 36.0–46.0)
Hemoglobin: 12.6 g/dL (ref 12.0–15.0)
Immature Granulocytes: 1 %
Lymphocytes Relative: 2 %
Lymphs Abs: 0.4 10*3/uL — ABNORMAL LOW (ref 0.7–4.0)
MCH: 31.7 pg (ref 26.0–34.0)
MCHC: 33.3 g/dL (ref 30.0–36.0)
MCV: 95 fL (ref 80.0–100.0)
Monocytes Absolute: 1.4 10*3/uL — ABNORMAL HIGH (ref 0.1–1.0)
Monocytes Relative: 8 %
Neutro Abs: 15.9 10*3/uL — ABNORMAL HIGH (ref 1.7–7.7)
Neutrophils Relative %: 88 %
Platelets: 231 10*3/uL (ref 150–400)
RBC: 3.98 MIL/uL (ref 3.87–5.11)
RDW: 12.4 % (ref 11.5–15.5)
WBC: 18.1 10*3/uL — ABNORMAL HIGH (ref 4.0–10.5)
nRBC: 0 % (ref 0.0–0.2)

## 2021-02-28 LAB — TYPE AND SCREEN
ABO/RH(D): A POS
Antibody Screen: NEGATIVE

## 2021-02-28 LAB — SURGICAL PCR SCREEN
MRSA, PCR: NEGATIVE
Staphylococcus aureus: NEGATIVE

## 2021-02-28 LAB — RESP PANEL BY RT-PCR (FLU A&B, COVID) ARPGX2
Influenza A by PCR: NEGATIVE
Influenza B by PCR: NEGATIVE
SARS Coronavirus 2 by RT PCR: NEGATIVE

## 2021-02-28 LAB — PROTIME-INR
INR: 1.2 (ref 0.8–1.2)
Prothrombin Time: 15.4 seconds — ABNORMAL HIGH (ref 11.4–15.2)

## 2021-02-28 LAB — CK: Total CK: 4686 U/L — ABNORMAL HIGH (ref 38–234)

## 2021-02-28 LAB — MAGNESIUM: Magnesium: 1.6 mg/dL — ABNORMAL LOW (ref 1.7–2.4)

## 2021-02-28 SURGERY — INSERTION, INTRAMEDULLARY ROD, FEMUR, RETROGRADE
Anesthesia: General | Laterality: Right

## 2021-02-28 MED ORDER — LORAZEPAM 2 MG/ML IJ SOLN
0.5000 mg | Freq: Once | INTRAMUSCULAR | Status: AC
  Administered 2021-02-28: 13:00:00 0.5 mg via INTRAVENOUS
  Filled 2021-02-28: qty 1

## 2021-02-28 MED ORDER — PHENYLEPHRINE 40 MCG/ML (10ML) SYRINGE FOR IV PUSH (FOR BLOOD PRESSURE SUPPORT)
PREFILLED_SYRINGE | INTRAVENOUS | Status: AC
  Filled 2021-02-28: qty 10

## 2021-02-28 MED ORDER — CEFAZOLIN SODIUM-DEXTROSE 2-3 GM-%(50ML) IV SOLR
INTRAVENOUS | Status: DC | PRN
  Administered 2021-02-28: 2 g via INTRAVENOUS

## 2021-02-28 MED ORDER — TRANEXAMIC ACID 1000 MG/10ML IV SOLN: 2000.0000 mg | INTRAVENOUS | Status: DC

## 2021-02-28 MED ORDER — OXYCODONE-ACETAMINOPHEN 5-325 MG PO TABS
1.0000 | ORAL_TABLET | Freq: Three times a day (TID) | ORAL | 0 refills | Status: AC | PRN
Start: 2021-02-28 — End: ?

## 2021-02-28 MED ORDER — SORBITOL 70 % SOLN: 30.0000 mL | Freq: Every day | Status: DC | PRN

## 2021-02-28 MED ORDER — ONDANSETRON HCL 4 MG/2ML IJ SOLN
INTRAMUSCULAR | Status: DC | PRN
  Administered 2021-02-28: 4 mg via INTRAVENOUS

## 2021-02-28 MED ORDER — 0.9 % SODIUM CHLORIDE (POUR BTL) OPTIME
TOPICAL | Status: DC | PRN
  Administered 2021-02-28: 21:00:00 1000 mL

## 2021-02-28 MED ORDER — ENOXAPARIN SODIUM 40 MG/0.4ML IJ SOSY: 40.0000 mg | PREFILLED_SYRINGE | Freq: Every day | INTRAMUSCULAR | 0 refills | Status: DC

## 2021-02-28 MED ORDER — EPHEDRINE 5 MG/ML INJ
INTRAVENOUS | Status: AC
  Filled 2021-02-28: qty 5

## 2021-02-28 MED ORDER — TRANEXAMIC ACID-NACL 1000-0.7 MG/100ML-% IV SOLN
1000.0000 mg | Freq: Once | INTRAVENOUS | Status: AC
  Administered 2021-02-28: 1000 mg via INTRAVENOUS
  Filled 2021-02-28: qty 100

## 2021-02-28 MED ORDER — ROCURONIUM BROMIDE 100 MG/10ML IV SOLN
INTRAVENOUS | Status: DC | PRN
  Administered 2021-02-28: 50 mg via INTRAVENOUS

## 2021-02-28 MED ORDER — LIDOCAINE 2% (20 MG/ML) 5 ML SYRINGE
INTRAMUSCULAR | Status: AC
  Filled 2021-02-28: qty 5

## 2021-02-28 MED ORDER — ONDANSETRON HCL 4 MG/2ML IJ SOLN
4.0000 mg | Freq: Once | INTRAMUSCULAR | Status: AC
  Administered 2021-02-28: 11:00:00 4 mg via INTRAVENOUS
  Filled 2021-02-28: qty 2

## 2021-02-28 MED ORDER — FENTANYL CITRATE (PF) 250 MCG/5ML IJ SOLN
INTRAMUSCULAR | Status: AC
  Filled 2021-02-28: qty 5

## 2021-02-28 MED ORDER — BUPIVACAINE HCL (PF) 0.25 % IJ SOLN
INTRAMUSCULAR | Status: AC
  Filled 2021-02-28: qty 30

## 2021-02-28 MED ORDER — MORPHINE SULFATE (PF) 2 MG/ML IV SOLN
1.0000 mg | INTRAVENOUS | Status: DC | PRN
  Administered 2021-02-28 (×2): 1 mg via INTRAVENOUS
  Filled 2021-02-28 (×2): qty 1

## 2021-02-28 MED ORDER — ENOXAPARIN SODIUM 40 MG/0.4ML IJ SOSY
40.0000 mg | PREFILLED_SYRINGE | INTRAMUSCULAR | Status: DC
  Administered 2021-03-01 – 2021-03-04 (×4): 40 mg via SUBCUTANEOUS
  Filled 2021-02-28 (×4): qty 0.4

## 2021-02-28 MED ORDER — FENTANYL CITRATE (PF) 250 MCG/5ML IJ SOLN
INTRAMUSCULAR | Status: DC | PRN
  Administered 2021-02-28 (×2): 50 ug via INTRAVENOUS

## 2021-02-28 MED ORDER — BUPIVACAINE HCL (PF) 0.25 % IJ SOLN
INTRAMUSCULAR | Status: DC | PRN
  Administered 2021-02-28: 20 mL

## 2021-02-28 MED ORDER — OXYCODONE HCL 5 MG PO TABS: 10.0000 mg | ORAL_TABLET | ORAL | Status: DC | PRN

## 2021-02-28 MED ORDER — MENTHOL 3 MG MT LOZG: 1.0000 | LOZENGE | OROMUCOSAL | Status: DC | PRN

## 2021-02-28 MED ORDER — OXYCODONE HCL 5 MG PO TABS: 5.0000 mg | ORAL_TABLET | ORAL | Status: DC | PRN

## 2021-02-28 MED ORDER — SODIUM CHLORIDE 0.9 % IV BOLUS
500.0000 mL | Freq: Once | INTRAVENOUS | Status: AC
  Administered 2021-02-28: 13:00:00 500 mL via INTRAVENOUS

## 2021-02-28 MED ORDER — TRANEXAMIC ACID-NACL 1000-0.7 MG/100ML-% IV SOLN: 1000.0000 mg | INTRAVENOUS | Status: DC

## 2021-02-28 MED ORDER — LACTATED RINGERS IV SOLN: INTRAVENOUS | Status: DC | PRN

## 2021-02-28 MED ORDER — PROPOFOL 10 MG/ML IV BOLUS
INTRAVENOUS | Status: AC
  Filled 2021-02-28: qty 20

## 2021-02-28 MED ORDER — SODIUM CHLORIDE 0.9 % IV SOLN: INTRAVENOUS | Status: AC

## 2021-02-28 MED ORDER — FENTANYL CITRATE (PF) 100 MCG/2ML IJ SOLN: 25.0000 ug | INTRAMUSCULAR | Status: DC | PRN

## 2021-02-28 MED ORDER — DOCUSATE SODIUM 100 MG PO CAPS
100.0000 mg | ORAL_CAPSULE | Freq: Two times a day (BID) | ORAL | Status: DC
  Administered 2021-02-28 – 2021-03-04 (×8): 100 mg via ORAL
  Filled 2021-02-28 (×8): qty 1

## 2021-02-28 MED ORDER — POVIDONE-IODINE 10 % EX SWAB: 2.0000 "application " | Freq: Once | CUTANEOUS | Status: DC

## 2021-02-28 MED ORDER — OXYCODONE HCL 5 MG PO TABS
5.0000 mg | ORAL_TABLET | ORAL | Status: DC | PRN
  Administered 2021-03-02 (×2): 5 mg via ORAL
  Administered 2021-03-04: 09:00:00 10 mg via ORAL
  Filled 2021-02-28: qty 1
  Filled 2021-02-28: qty 2
  Filled 2021-02-28: qty 1

## 2021-02-28 MED ORDER — HYDROMORPHONE HCL 1 MG/ML IJ SOLN: 0.5000 mg | INTRAMUSCULAR | Status: DC | PRN

## 2021-02-28 MED ORDER — ONDANSETRON HCL 4 MG/2ML IJ SOLN
INTRAMUSCULAR | Status: AC
  Filled 2021-02-28: qty 2

## 2021-02-28 MED ORDER — MORPHINE SULFATE (PF) 4 MG/ML IV SOLN
4.0000 mg | Freq: Once | INTRAVENOUS | Status: AC
  Administered 2021-02-28: 12:00:00 4 mg via INTRAVENOUS
  Filled 2021-02-28: qty 1

## 2021-02-28 MED ORDER — ENOXAPARIN SODIUM 40 MG/0.4ML IJ SOSY: 40.0000 mg | PREFILLED_SYRINGE | Freq: Every day | INTRAMUSCULAR | 0 refills | Status: AC

## 2021-02-28 MED ORDER — CEFAZOLIN SODIUM-DEXTROSE 2-4 GM/100ML-% IV SOLN
2.0000 g | Freq: Two times a day (BID) | INTRAVENOUS | Status: AC
  Administered 2021-03-01 (×2): 2 g via INTRAVENOUS
  Filled 2021-02-28 (×2): qty 100

## 2021-02-28 MED ORDER — CEFAZOLIN SODIUM-DEXTROSE 2-4 GM/100ML-% IV SOLN: 2.0000 g | INTRAVENOUS | Status: DC

## 2021-02-28 MED ORDER — LIDOCAINE HCL (CARDIAC) PF 100 MG/5ML IV SOSY
PREFILLED_SYRINGE | INTRAVENOUS | Status: DC | PRN
  Administered 2021-02-28: 60 mg via INTRATRACHEAL

## 2021-02-28 MED ORDER — CEFAZOLIN SODIUM 1 G IJ SOLR
INTRAMUSCULAR | Status: AC
  Filled 2021-02-28: qty 20

## 2021-02-28 MED ORDER — PHENYLEPHRINE HCL-NACL 20-0.9 MG/250ML-% IV SOLN
INTRAVENOUS | Status: DC | PRN
  Administered 2021-02-28: 30 ug/min via INTRAVENOUS

## 2021-02-28 MED ORDER — POTASSIUM CHLORIDE 10 MEQ/100ML IV SOLN
10.0000 meq | INTRAVENOUS | Status: AC
  Administered 2021-02-28 (×2): 10 meq via INTRAVENOUS
  Filled 2021-02-28 (×2): qty 100

## 2021-02-28 MED ORDER — ROCURONIUM BROMIDE 10 MG/ML (PF) SYRINGE
PREFILLED_SYRINGE | INTRAVENOUS | Status: AC
  Filled 2021-02-28: qty 10

## 2021-02-28 MED ORDER — METHOCARBAMOL 500 MG PO TABS
500.0000 mg | ORAL_TABLET | Freq: Four times a day (QID) | ORAL | Status: DC | PRN
  Administered 2021-03-02 – 2021-03-03 (×3): 500 mg via ORAL
  Filled 2021-02-28 (×3): qty 1

## 2021-02-28 MED ORDER — SODIUM CHLORIDE 0.9 % IV SOLN: INTRAVENOUS | Status: DC

## 2021-02-28 MED ORDER — PHENYLEPHRINE HCL (PRESSORS) 10 MG/ML IV SOLN
INTRAVENOUS | Status: DC | PRN
  Administered 2021-02-28: 80 ug via INTRAVENOUS

## 2021-02-28 MED ORDER — PROPOFOL 10 MG/ML IV BOLUS
INTRAVENOUS | Status: DC | PRN
  Administered 2021-02-28: 100 mg via INTRAVENOUS

## 2021-02-28 MED ORDER — FLUTICASONE FUROATE-VILANTEROL 100-25 MCG/ACT IN AEPB
1.0000 | INHALATION_SPRAY | Freq: Every day | RESPIRATORY_TRACT | Status: DC
  Administered 2021-03-02 – 2021-03-04 (×3): 1 via RESPIRATORY_TRACT
  Filled 2021-02-28: qty 28

## 2021-02-28 MED ORDER — SUGAMMADEX SODIUM 200 MG/2ML IV SOLN
INTRAVENOUS | Status: DC | PRN
  Administered 2021-02-28: 200 mg via INTRAVENOUS

## 2021-02-28 MED ORDER — ACETAMINOPHEN 500 MG PO TABS
1000.0000 mg | ORAL_TABLET | Freq: Four times a day (QID) | ORAL | Status: AC
  Administered 2021-02-28 – 2021-03-01 (×4): 1000 mg via ORAL
  Filled 2021-02-28 (×4): qty 2

## 2021-02-28 MED ORDER — PHENOL 1.4 % MT LIQD: 1.0000 | OROMUCOSAL | Status: DC | PRN

## 2021-02-28 MED ORDER — ACETAMINOPHEN 325 MG PO TABS
325.0000 mg | ORAL_TABLET | Freq: Four times a day (QID) | ORAL | Status: DC | PRN
  Administered 2021-03-02 – 2021-03-03 (×2): 650 mg via ORAL
  Filled 2021-02-28 (×3): qty 2

## 2021-02-28 MED ORDER — FENTANYL CITRATE PF 50 MCG/ML IJ SOSY
50.0000 ug | PREFILLED_SYRINGE | Freq: Once | INTRAMUSCULAR | Status: AC
  Administered 2021-02-28: 11:00:00 50 ug via INTRAVENOUS
  Filled 2021-02-28: qty 1

## 2021-02-28 MED ORDER — MEMANTINE HCL 10 MG PO TABS
10.0000 mg | ORAL_TABLET | Freq: Two times a day (BID) | ORAL | Status: DC
  Administered 2021-03-01 – 2021-03-04 (×7): 10 mg via ORAL
  Filled 2021-02-28 (×8): qty 1

## 2021-02-28 MED ORDER — DONEPEZIL HCL 10 MG PO TABS
10.0000 mg | ORAL_TABLET | Freq: Every day | ORAL | Status: DC
  Administered 2021-03-01 – 2021-03-04 (×4): 10 mg via ORAL
  Filled 2021-02-28 (×4): qty 1

## 2021-02-28 MED ORDER — METOCLOPRAMIDE HCL 5 MG PO TABS: 5.0000 mg | ORAL_TABLET | Freq: Three times a day (TID) | ORAL | Status: DC | PRN

## 2021-02-28 MED ORDER — TRAZODONE HCL 50 MG PO TABS
50.0000 mg | ORAL_TABLET | Freq: Every day | ORAL | Status: DC
  Administered 2021-03-01 – 2021-03-03 (×3): 50 mg via ORAL
  Filled 2021-02-28 (×3): qty 1

## 2021-02-28 MED ORDER — METHOCARBAMOL 1000 MG/10ML IJ SOLN: 500.0000 mg | Freq: Four times a day (QID) | INTRAVENOUS | Status: DC | PRN

## 2021-02-28 MED ORDER — POLYETHYLENE GLYCOL 3350 17 G PO PACK
17.0000 g | PACK | Freq: Every day | ORAL | Status: DC | PRN
  Administered 2021-03-01: 11:00:00 17 g via ORAL

## 2021-02-28 MED ORDER — LEVOTHYROXINE SODIUM 75 MCG PO TABS
75.0000 ug | ORAL_TABLET | Freq: Every day | ORAL | Status: DC
  Administered 2021-03-01 – 2021-03-04 (×4): 75 ug via ORAL
  Filled 2021-02-28 (×4): qty 1

## 2021-02-28 MED ORDER — ALUM & MAG HYDROXIDE-SIMETH 200-200-20 MG/5ML PO SUSP: 30.0000 mL | ORAL | Status: DC | PRN

## 2021-02-28 MED ORDER — OXYCODONE-ACETAMINOPHEN 5-325 MG PO TABS: 1.0000 | ORAL_TABLET | Freq: Three times a day (TID) | ORAL | 0 refills | Status: DC | PRN

## 2021-02-28 MED ORDER — ESCITALOPRAM OXALATE 20 MG PO TABS
20.0000 mg | ORAL_TABLET | Freq: Every day | ORAL | Status: DC
  Administered 2021-03-01 – 2021-03-04 (×4): 20 mg via ORAL
  Filled 2021-02-28 (×4): qty 1

## 2021-02-28 MED ORDER — METOCLOPRAMIDE HCL 5 MG/ML IJ SOLN: 5.0000 mg | Freq: Three times a day (TID) | INTRAMUSCULAR | Status: DC | PRN

## 2021-02-28 MED ORDER — ALBUTEROL SULFATE (2.5 MG/3ML) 0.083% IN NEBU: 2.5000 mg | INHALATION_SOLUTION | RESPIRATORY_TRACT | Status: DC | PRN

## 2021-02-28 SURGICAL SUPPLY — 75 items
ALCOHOL 70% 16 OZ (MISCELLANEOUS) ×1 IMPLANT
BAG COUNTER SPONGE SURGICOUNT (BAG) IMPLANT
BIT DRILL CALIBRATED 4.3MMX365 (DRILL) IMPLANT
BIT DRILL CROWE PNT TWST 4.5MM (DRILL) IMPLANT
BLADE CLIPPER SURG (BLADE) IMPLANT
BLADE SURG 15 STRL LF DISP TIS (BLADE) ×1 IMPLANT
BLADE SURG 15 STRL SS (BLADE) ×1
BNDG COHESIVE 4X5 TAN ST LF (GAUZE/BANDAGES/DRESSINGS) ×1 IMPLANT
BNDG ELASTIC 6X10 VLCR STRL LF (GAUZE/BANDAGES/DRESSINGS) ×3 IMPLANT
BNDG GAUZE ELAST 4 BULKY (GAUZE/BANDAGES/DRESSINGS) ×2 IMPLANT
COVER SURGICAL LIGHT HANDLE (MISCELLANEOUS) ×2 IMPLANT
CUFF TOURN SGL QUICK 34 (TOURNIQUET CUFF)
CUFF TOURN SGL QUICK 42 (TOURNIQUET CUFF) IMPLANT
CUFF TRNQT CYL 34X4.125X (TOURNIQUET CUFF) IMPLANT
DRAPE C-ARM 42X72 X-RAY (DRAPES) ×2 IMPLANT
DRAPE C-ARMOR (DRAPES) ×2 IMPLANT
DRAPE HALF SHEET 40X57 (DRAPES) ×4 IMPLANT
DRAPE IMP U-DRAPE 54X76 (DRAPES) ×4 IMPLANT
DRAPE INCISE IOBAN 66X45 STRL (DRAPES) ×2 IMPLANT
DRAPE U-SHAPE 47X51 STRL (DRAPES) ×3 IMPLANT
DRESSING MEPILEX FLEX 4X4 (GAUZE/BANDAGES/DRESSINGS) IMPLANT
DRILL CALIBRATED 4.3MMX365 (DRILL) ×4
DRILL CROWE POINT TWIST 4.5MM (DRILL) ×4
DRSG MEPILEX BORDER 4X4 (GAUZE/BANDAGES/DRESSINGS) ×2 IMPLANT
DRSG MEPILEX BORDER 4X8 (GAUZE/BANDAGES/DRESSINGS) ×1 IMPLANT
DRSG MEPILEX FLEX 4X4 (GAUZE/BANDAGES/DRESSINGS) ×2
DURAPREP 26ML APPLICATOR (WOUND CARE) ×4 IMPLANT
ELECT REM PT RETURN 9FT ADLT (ELECTROSURGICAL) ×2
ELECTRODE REM PT RTRN 9FT ADLT (ELECTROSURGICAL) ×1 IMPLANT
GAUZE SPONGE 4X4 12PLY STRL (GAUZE/BANDAGES/DRESSINGS) IMPLANT
GAUZE XEROFORM 5X9 LF (GAUZE/BANDAGES/DRESSINGS) IMPLANT
GLOVE SURG LTX SZ7 (GLOVE) ×3 IMPLANT
GLOVE SURG SYN 7.5  E (GLOVE) ×2
GLOVE SURG SYN 7.5 E (GLOVE) ×2 IMPLANT
GLOVE SURG SYN 7.5 PF PI (GLOVE) ×1 IMPLANT
GLOVE SURG UNDER POLY LF SZ7 (GLOVE) ×26 IMPLANT
GLOVE SURG UNDER POLY LF SZ7.5 (GLOVE) ×4 IMPLANT
GOWN STRL REIN XL XLG (GOWN DISPOSABLE) ×2 IMPLANT
GOWN STRL REUS W/ TWL LRG LVL3 (GOWN DISPOSABLE) ×2 IMPLANT
GOWN STRL REUS W/TWL LRG LVL3 (GOWN DISPOSABLE) ×1
GUIDEPIN VERSANAIL DSP 3.2X444 (ORTHOPEDIC DISPOSABLE SUPPLIES) ×1 IMPLANT
GUIDEWIRE BEAD TIP (WIRE) ×1 IMPLANT
KIT BASIN OR (CUSTOM PROCEDURE TRAY) ×2 IMPLANT
KIT TURNOVER KIT B (KITS) ×2 IMPLANT
MANIFOLD NEPTUNE II (INSTRUMENTS) ×2 IMPLANT
NAIL FEM RETRO 10.5X360 (Nail) ×1 IMPLANT
NDL HYPO 25GX1X1/2 BEV (NEEDLE) ×1 IMPLANT
NEEDLE HYPO 25GX1X1/2 BEV (NEEDLE) ×4 IMPLANT
NS IRRIG 1000ML POUR BTL (IV SOLUTION) ×4 IMPLANT
PACK ORTHO EXTREMITY (CUSTOM PROCEDURE TRAY) ×2 IMPLANT
PACK UNIVERSAL I (CUSTOM PROCEDURE TRAY) ×2 IMPLANT
PAD ARMBOARD 7.5X6 YLW CONV (MISCELLANEOUS) ×4 IMPLANT
PADDING CAST COTTON 6X4 STRL (CAST SUPPLIES) IMPLANT
SCREW CORT TI DBL LEAD 5X34 (Screw) ×1 IMPLANT
SCREW CORT TI DBL LEAD 5X38 (Screw) ×1 IMPLANT
SCREW CORT TI DBL LEAD 5X56 (Screw) ×1 IMPLANT
SCREW CORT TI DBL LEAD 5X65 (Screw) ×1 IMPLANT
SCREW CORT TI DBL LEAD 5X75 (Screw) ×1 IMPLANT
SCREW CORT TI DBLE LEAD 5X54 (Screw) ×1 IMPLANT
SPONGE T-LAP 18X18 ~~LOC~~+RFID (SPONGE) ×2 IMPLANT
STAPLER VISISTAT 35W (STAPLE) ×1 IMPLANT
STOCKINETTE 6  STRL (DRAPES) ×1
STOCKINETTE 6 STRL (DRAPES) ×1 IMPLANT
STOCKINETTE IMPERVIOUS LG (DRAPES) ×1 IMPLANT
SUT VIC AB 0 CT1 27 (SUTURE) ×1
SUT VIC AB 0 CT1 27XBRD ANBCTR (SUTURE) IMPLANT
SUT VIC AB 2-0 CT1 27 (SUTURE) ×1
SUT VIC AB 2-0 CT1 TAPERPNT 27 (SUTURE) IMPLANT
SYR 20ML ECCENTRIC (SYRINGE) ×3 IMPLANT
TOWEL GREEN STERILE (TOWEL DISPOSABLE) ×2 IMPLANT
TOWEL GREEN STERILE FF (TOWEL DISPOSABLE) ×2 IMPLANT
TRAY FOLEY W/BAG SLVR 14FR (SET/KITS/TRAYS/PACK) ×1 IMPLANT
TUBE CONNECTING 20X1/4 (TUBING) ×2 IMPLANT
WATER STERILE IRR 1000ML POUR (IV SOLUTION) ×1 IMPLANT
YANKAUER SUCT BULB TIP NO VENT (SUCTIONS) ×2 IMPLANT

## 2021-02-28 NOTE — Assessment & Plan Note (Signed)
Borderline prolonged Check magnesium, replace potassium Place on telemetry Avoid qt prolonging drugs Repeat ekg in AM

## 2021-02-28 NOTE — Assessment & Plan Note (Signed)
Baseline creatinine appears to be .70-1.07 Likely pre renal from decreased PO intake and fall Checking CK, UA pending 500cc bolus given and will continue gentle IVF Avoid nephrotoxic drugs Intake/output Trend bmp

## 2021-02-28 NOTE — Progress Notes (Addendum)
Orthopedic Tech Progress Note Patient Details:  Unita Detamore 1931-08-20 [id number]  24" knee immobilizer applied to RLE over bucks traction boot. 10 Lbs used for traction.   Ortho Devices Type of Ortho Device: Knee Immobilizer Ortho Device/Splint Location: RLE over buck's traction boot Ortho Device/Splint Interventions: Ordered, Application, Adjustment   Post Interventions Patient Tolerated: Fair Instructions Provided: Care of device  Madisen Ludvigsen Carmine Savoy 02/28/2021, 3:51 PM

## 2021-02-28 NOTE — H&P (Signed)
History and Physical    Colleen Garrison [id number] DOB: October 25, 1931 DOA: 02/28/2021  PCP: Loraine Leriche., MD Consultants:  neurology: Dr. Everette Rank, pulmonology: Dr. Camillo Flaming  Patient coming from:  [facility], independent living. Lives alone.    Chief Complaint: mechanical fall   HPI: Colleen Garrison is a 87 y.o. female with medical history significant of depression, insomnia, hypothyroidism, dementia and CKD stage III who presented to ED after fall.  Her daughter is here to help with history. She states she rolled out of bed around 8:30-9:00am. She states she wasn't trying to get out of bed, but must have been close to the edge and just accidentally rolled off the bed onto her right leg. She had immediate pain and was unable to stand up. She didn't have her life alert button on and banged on the walls and yelled and her neighbors heard her and called EMS. They do not think she was on the ground for a long period of time.   She denies any loss of sensation in her left leg/toes and is able to move her lower left leg. Pain rated as a 5/10 and is stabbing in nature.   She overall has felt fine except for being a little more "wobbly" than normal. She actually fell on 12/23 walking to her car and was seen in ED and sent home. She denies any fever/chills, headaches or visions changes, chest pain or palpitations, shortness of breath or cough, stomach pain, N/V/D, dysuria/urgency or frequency, no increased leg swelling from baseline.    ED Course: vitals: temperature: 97.5, bp: 115/57, HR: 60, RR: 14, oxygen: 96% room air.  Pertinent labs: wbc: 18.1, potassium: 3.3, creatinine 1.50,  Right femur xray: acute obliquely oriented, displaced and foreshortened fracture of the distal femoral diaphysis without definitive intra-articular extension.  Right knee xray: moderate to severe degenerative change of the knee, worse within the lateral compartment.  In ED: patient given 500cc bolus,  morphine, fentanyl, ativan and ortho consulted. Plans for OR today if cleared by medicine. [facility] was asked to admit.   Review of Systems: As per HPI; otherwise review of systems reviewed and negative.   Ambulatory Status:  Ambulates without assistance  Past Medical History:  Diagnosis Date   Depression    Hiatal hernia    Memory loss    Thyroid disease     History reviewed. No pertinent surgical history.  Social History   Socioeconomic History   Marital status: Widowed    Spouse name: Not on file   Number of children: Not on file   Years of education: Not on file   Highest education level: Not on file  Occupational History   Not on file  Tobacco Use   Smoking status: Former   Smokeless tobacco: Never  Vaping Use   Vaping Use: Never used  Substance and Sexual Activity   Alcohol use: No   Drug use: Never   Sexual activity: Not on file  Other Topics Concern   Not on file  Social History Narrative   Not on file   Social Determinants of Health   Financial Resource Strain: Not on file  Food Insecurity: Not on file  Transportation Needs: Not on file  Physical Activity: Not on file  Stress: Not on file  Social Connections: Not on file  Intimate Partner Violence: Not on file    Allergies  Allergen Reactions   Codeine Hives and Nausea And Vomiting    History reviewed. No pertinent family history.  Prior to Admission medications   Medication Sig Start Date End Date Taking? Authorizing Provider  acetaminophen (TYLENOL) 500 MG tablet Take 500 mg by mouth every 6 (six) hours as needed for mild pain, fever or headache.   Yes [provider]  ascorbic acid (VITAMIN C) 500 MG tablet Take 1 tablet (500 mg total) by mouth daily. 03/11/19  Yes Lavina Hamman, MD  b complex vitamins capsule Take 1 capsule by mouth daily.    Yes [provider]  BREO ELLIPTA 100-25 MCG/ACT AEPB Inhale 1 puff into the lungs daily. 01/02/21  Yes [provider]   Cholecalciferol 25 MCG (1000 UT) tablet Take 1,000 Units by mouth daily.    Yes [provider]  donepezil (ARICEPT) 10 MG tablet Take 10 mg by mouth daily. 02/03/21  Yes [provider]  escitalopram (LEXAPRO) 20 MG tablet Take 20 mg by mouth daily. 01/26/21  Yes [provider]  levothyroxine (SYNTHROID) 75 MCG tablet Take 75 mcg by mouth daily. 12/22/20  Yes [provider]  memantine (NAMENDA) 10 MG tablet Take 10 mg by mouth 2 (two) times daily. 01/12/21  Yes [provider]  Omega-3 Fatty Acids (OMEGA-3 FISH OIL PO) Take 1 capsule by mouth daily.    Yes [provider]  traZODone (DESYREL) 50 MG tablet Take 50 mg by mouth at bedtime. 01/12/21  Yes [provider]  VITAMIN K PO Take 1 tablet by mouth daily.   Yes [provider]  zinc sulfate 220 (50 Zn) MG capsule Take 1 capsule (220 mg total) by mouth daily. Patient not taking: Reported on 02/28/2021 03/11/19   Lavina Hamman, MD    Physical Exam: Vitals:   02/28/21 1230 02/28/21 1300 02/28/21 1315 02/28/21 1330  BP: (!) 88/57 123/73 (!) 129/47 (!) 133/118  Pulse: (!) 57 60 61 66  Resp: 14 18 17 15   Temp:      TempSrc:      SpO2: 96% 98% 99% 95%  Weight:      Height:         General:  Appears calm and comfortable and is in NAD Eyes:  PERRL, EOMI, normal lids, iris ENT:  grossly normal hearing, lips & tongue, mmm; dentures Neck:  no LAD, masses or thyromegaly; no carotid bruits Cardiovascular:  RRR, 2/6 systolic murmur. Trace bilateral LE edema  Respiratory:   CTA bilaterally with no wheezes/rales/rhonchi.  Normal respiratory effort. Abdomen:  soft, NT, ND, NABS Back:   normal alignment, no CVAT Skin:  no rash or induration seen on limited exam Musculoskeletal:  right leg shortened and abducted. Sensation intact and can move foot/toes. TTP distal end of femur  Lower extremity:   Limited foot exam with no ulcerations.  2+ distal pulses. Psychiatric:   grossly normal mood and affect, speech fluent and appropriate, At baseline mentation. Oriented to place/self, not to date.  Neurologic:  CN 2-12 grossly intact, moves all extremities in coordinated fashion, sensation intact    Radiological Exams on Admission: Independently reviewed - see discussion in A/P where applicable  DG Knee 1-2 Views Right  Result Date: 02/28/2021 CLINICAL DATA:  Post fall, now with right knee pain. EXAM: RIGHT KNEE - 1-2 VIEW COMPARISON:  Right femur radiographs-earlier same day FINDINGS: There is an acute obliquely oriented, displaced and foreshortened fracture of the distal femoral diaphysis. No intra-articular extension. Expected adjacent soft tissue swelling. Moderate to severe degenerative change of the knee, worse within the lateral compartment with joint space  loss, subchondral sclerosis and osteophytosis. There is spurring of the tibial spines. No definite knee joint effusion. Scattered adjacent vascular calcifications. No radiopaque foreign body. IMPRESSION: 1. Acute obliquely oriented, displaced and foreshortened fracture of the distal femoral diaphysis without definitive intra-articular extension. 2. Moderate to severe degenerative change of the knee, worse within the lateral compartment. Electronically Signed   By: Sandi Mariscal M.D.   On: 02/28/2021 10:58   CT Head Wo Contrast  Result Date: 02/26/2021 CLINICAL DATA:  Fall, head injury EXAM: CT HEAD WITHOUT CONTRAST CT CERVICAL SPINE WITHOUT CONTRAST TECHNIQUE: Multidetector CT imaging of the head and cervical spine was performed following the standard protocol without intravenous contrast. Multiplanar CT image reconstructions of the cervical spine were also generated. COMPARISON:  CT head 03/07/2019 FINDINGS: CT HEAD FINDINGS Brain: Mild atrophy. Patchy white matter hypodensity bilaterally unchanged compatible with chronic microvascular ischemia. Negative for acute infarct, hemorrhage, mass. Vascular: Negative for  hyperdense vessel Skull: Negative Sinuses/Orbits: Paranasal sinuses clear. Bilateral cataract surgery. Other: Posterior convexity scalp laceration and scalp hematoma in the midline. Gas in the subcutaneous tissues. CT CERVICAL SPINE FINDINGS Alignment: Anterolisthesis C7-T1.  Otherwise normal alignment Skull base and vertebrae: Negative for fracture Soft tissues and spinal canal: No soft tissue mass or edema. Disc levels: Disc degeneration and spurring most prominent C4-5 and C5-6 causing foraminal narrowing bilaterally. Upper chest: Apical scarring bilaterally with emphysema. Other: None IMPRESSION: 1. No acute intracranial abnormality 2. Posterior scalp laceration and hematoma 3. Negative for cervical spine fracture. Electronically Signed   By: Franchot Gallo M.D.   On: 02/26/2021 14:14   CT Cervical Spine Wo Contrast  Result Date: 02/26/2021 CLINICAL DATA:  Fall, head injury EXAM: CT HEAD WITHOUT CONTRAST CT CERVICAL SPINE WITHOUT CONTRAST TECHNIQUE: Multidetector CT imaging of the head and cervical spine was performed following the standard protocol without intravenous contrast. Multiplanar CT image reconstructions of the cervical spine were also generated. COMPARISON:  CT head 03/07/2019 FINDINGS: CT HEAD FINDINGS Brain: Mild atrophy. Patchy white matter hypodensity bilaterally unchanged compatible with chronic microvascular ischemia. Negative for acute infarct, hemorrhage, mass. Vascular: Negative for hyperdense vessel Skull: Negative Sinuses/Orbits: Paranasal sinuses clear. Bilateral cataract surgery. Other: Posterior convexity scalp laceration and scalp hematoma in the midline. Gas in the subcutaneous tissues. CT CERVICAL SPINE FINDINGS Alignment: Anterolisthesis C7-T1.  Otherwise normal alignment Skull base and vertebrae: Negative for fracture Soft tissues and spinal canal: No soft tissue mass or edema. Disc levels: Disc degeneration and spurring most prominent C4-5 and C5-6 causing foraminal narrowing  bilaterally. Upper chest: Apical scarring bilaterally with emphysema. Other: None IMPRESSION: 1. No acute intracranial abnormality 2. Posterior scalp laceration and hematoma 3. Negative for cervical spine fracture. Electronically Signed   By: Franchot Gallo M.D.   On: 02/26/2021 14:14   DG CHEST PORT 1 VIEW  Result Date: 02/28/2021 CLINICAL DATA:  Preop evaluation, fracture right femur EXAM: PORTABLE CHEST 1 VIEW COMPARISON:  03/07/2019 FINDINGS: Transverse diameter of heart is increased. There is soft tissue fullness in the retrocardiac region suggesting fixed hiatal hernia. There are no signs of alveolar pulmonary edema or focal pulmonary consolidation. There is subtle increased density in the lateral aspect of left lower lung fields. There is no significant pleural effusion or pneumothorax. IMPRESSION: Cardiomegaly. There are no signs of pulmonary edema or focal pulmonary consolidation. Subtle increased density in the lateral aspect of left lower lung fields may suggest scarring or subsegmental atelectasis. Large fixed hiatal hernia. Electronically Signed   By: Prudy Feeler.D.  On: 02/28/2021 13:34   DG Femur Min 2 Views Right  Result Date: 02/28/2021 CLINICAL DATA:  Post fall, now with right leg pain. EXAM: RIGHT FEMUR 2 VIEWS COMPARISON:  Right knee radiographs-earlier same day FINDINGS: There is an acute obliquely oriented, displaced and foreshortened fracture of the distal femoral diaphysis. No intra-articular extension. Expected adjacent soft tissue swelling. Moderate to severe degenerative change of the knee, worse within the lateral compartment with joint space loss, subchondral sclerosis and osteophytosis. There is spurring of the tibial spines. No definite knee joint effusion. Scattered adjacent vascular calcifications. No radiopaque foreign body. IMPRESSION: 1. Acute obliquely oriented, displaced and foreshortened fracture of the distal femoral diaphysis without definitive  intra-articular extension. 2. Moderate to severe degenerative change of the knee, worse within the lateral compartment. Electronically Signed   By: Simonne Come M.D.   On: 02/28/2021 10:57    EKG: Independently reviewed.  NSR with rate 60; nonspecific ST changes with no evidence of acute ischemia. Borderline prolonged qt, new finding.    Labs on Admission: I have personally reviewed the available labs and imaging studies at the time of the admission.  Pertinent labs:  wbc: 18.1,  potassium: 3.3,  creatinine 1.50,   Assessment/Plan Closed fracture of distal end of right femur, unspecified fracture morphology, initial encounter [facility]) 87 year old female presenting to ED after accidental fall off bed with subsequent right distal femur fracture Admit to medical telemetry Ortho consulted with plans for OR tonight if medically cleared NPO, SCD Pain control with morphine right now. Given 4mg  morphine and had subsequent drop in blood pressure and needed oxygen. Conservative use and monitor.  Ice pack Discussed with patient and family that risks include her age and pulmonary disease. She also had a mild AKI. Risks and benefits discussed, cleared for surgery  -fall precautions  -SW consult for rehab placement   Acute renal failure superimposed on stage 3a chronic kidney disease (HCC) Baseline creatinine appears to be .70-1.07 Likely pre renal from decreased PO intake and fall Checking CK, UA pending 500cc bolus given and will continue gentle IVF Avoid nephrotoxic drugs Intake/output Trend bmp   Hypokalemia Check magnesium Replete via IV [id number].  Recheck in AM   Leukocytosis Likely reactive as she has no signs or symptoms of infection.  Checking CXR and UA not yet collected Continue to trend and follow fever curve   Borderline prolonged QT interval Borderline prolonged Check magnesium, replace potassium Place on telemetry Avoid qt prolonging drugs Repeat ekg in AM      Centrilobular emphysema and restrictive lung disease  Emphysematous changes noted on CTA 03/08/2019 Ratio 82% FEV1 58% FVC 50% Continue Breo inhaler once daily as directed, and albuterol prn   Chronic respiratory failure with hypoxia (HCC) Stable, per pulm visit in 11/2020 she hasn't used oxygen in over a year.  Confirmed with family   Late onset Alzheimer dementia (HCC) Still living in independent living. Not oriented to date, but knows self/place/family and able to answer questions Continue aricept and namenda when PO Delirium and fall precautions   Insomnia Continue trazodone  Hypothyroidism TSH checked 02/23/21 and 6.79 Followed by PCP, continue current dose of synthroid   Depression Continue lexapro 20mg /daily    Body mass index is 26.63 kg/m.    Level of care: Telemetry Medical DVT prophylaxis:  SCDs Code Status:  DNR - confirmed with patient/family Family Communication: daughter at bedside: 02/25/21  Disposition Plan:  The patient is from: [facility], [place]  Anticipated  d/c is to: rehab  Requires inpatient hospitalization due to femur fracture and need for surgical fixation and is at significant risk of orsening, requires constant monitoring, assessment and MDM with specialists.    Patient is currently: stable  Consults called: ortho: Dr. Erlinda Hong  Admission status:  inpatient    Orma Flaming MD [facility]   How to contact the [facility] Attending or Consulting provider [place] or covering provider during after hours [place], for this patient?  Check the care team in [facility] and look for a) attending/consulting [facility] provider listed and b) the [facility] team listed Log into [email or web address] and use [place]'s universal password to access. If you do not have the password, please contact the hospital operator. Locate the [facility] provider you are looking for under [facility] and page to a number that you can be directly reached. If you still have difficulty  reaching the provider, please page the [facility] (Director on Call) for the Hospitalists listed on amion for assistance.   02/28/2021, 1:58 PM

## 2021-02-28 NOTE — Assessment & Plan Note (Signed)
Emphysematous changes noted on CTA 03/08/2019 Ratio 82% FEV1 58% FVC 50% Continue Breo inhaler once daily as directed, and albuterol prn

## 2021-02-28 NOTE — ED Notes (Signed)
Pt resting comfortably at this time.

## 2021-02-28 NOTE — ED Triage Notes (Signed)
Pt bib ems from home c/o fall. Denies LOC and no blood thinners. Pt was getting out of bed and missed the step stool. She fell on her rt knee with visible swelling. Pt has a previous hematoma on the back of head with two staples from previous fall.   150 mcg Fentanyl 4 mg Zofran  BP 136/64 HR 62 RA 95 RR 14 CBG 126

## 2021-02-28 NOTE — Assessment & Plan Note (Addendum)
87 year old female presenting to ED after accidental fall off bed with subsequent right distal femur fracture Admit to medical telemetry Ortho consulted with plans for OR tonight if medically cleared NPO, SCD Pain control with morphine right now, may need increased to dilaudid.  Ice pack Discussed with patient and family that risks include her age and pulmonary disease. She also had a mild AKI. Risks and benefits discussed, cleared for surgery  -fall precautions -SW consult for rehab placement

## 2021-02-28 NOTE — Consult Note (Signed)
ORTHOPAEDIC CONSULTATION  REQUESTING PHYSICIAN: Orland Mustard, MD  Chief Complaint: Right distal femur fracture  HPI: 87 year old with mild dementia in independent living.  Rolled out of bed overnight and struck RLE on a step.  Brought to the ED and found to have right distal femur fracture. Ortho consulted for surgical evaluation.  Past Medical History:  Diagnosis Date   Depression    Hiatal hernia    Memory loss    Thyroid disease    History reviewed. No pertinent surgical history. Social History   Socioeconomic History   Marital status: Widowed    Spouse name: Not on file   Number of children: Not on file   Years of education: Not on file   Highest education level: Not on file  Occupational History   Not on file  Tobacco Use   Smoking status: Former   Smokeless tobacco: Never  Vaping Use   Vaping Use: Never used  Substance and Sexual Activity   Alcohol use: No   Drug use: Never   Sexual activity: Not on file  Other Topics Concern   Not on file  Social History Narrative   Not on file   Social Determinants of Health   Financial Resource Strain: Not on file  Food Insecurity: Not on file  Transportation Needs: Not on file  Physical Activity: Not on file  Stress: Not on file  Social Connections: Not on file   History reviewed. No pertinent family history. Allergies  Allergen Reactions   Codeine Hives and Nausea And Vomiting   Prior to Admission medications   Medication Sig Start Date End Date Taking? Authorizing Provider  acetaminophen (TYLENOL) 500 MG tablet Take 500 mg by mouth every 6 (six) hours as needed for mild pain, fever or headache.   Yes [provider]  ascorbic acid (VITAMIN C) 500 MG tablet Take 1 tablet (500 mg total) by mouth daily. 03/11/19  Yes Rolly Salter, MD  b complex vitamins capsule Take 1 capsule by mouth daily.    Yes [provider]  BREO ELLIPTA 100-25 MCG/ACT AEPB Inhale 1 puff into the lungs daily.  01/02/21  Yes [provider]  Cholecalciferol 25 MCG (1000 UT) tablet Take 1,000 Units by mouth daily.    Yes [provider]  donepezil (ARICEPT) 10 MG tablet Take 10 mg by mouth daily. 02/03/21  Yes [provider]  escitalopram (LEXAPRO) 20 MG tablet Take 20 mg by mouth daily. 01/26/21  Yes [provider]  levothyroxine (SYNTHROID) 75 MCG tablet Take 75 mcg by mouth daily. 12/22/20  Yes [provider]  memantine (NAMENDA) 10 MG tablet Take 10 mg by mouth 2 (two) times daily. 01/12/21  Yes [provider]  Omega-3 Fatty Acids (OMEGA-3 FISH OIL PO) Take 1 capsule by mouth daily.    Yes [provider]  traZODone (DESYREL) 50 MG tablet Take 50 mg by mouth at bedtime. 01/12/21  Yes [provider]  VITAMIN K PO Take 1 tablet by mouth daily.   Yes [provider]  zinc sulfate 220 (50 Zn) MG capsule Take 1 capsule (220 mg total) by mouth daily. Patient not taking: Reported on 02/28/2021 03/11/19   Rolly Salter, MD   DG Knee 1-2 Views Right  Result Date: 02/28/2021 CLINICAL DATA:  Post fall, now with right knee pain. EXAM: RIGHT KNEE - 1-2 VIEW COMPARISON:  Right femur radiographs-earlier same day FINDINGS: There is an acute obliquely oriented, displaced and foreshortened  fracture of the distal femoral diaphysis. No intra-articular extension. Expected adjacent soft tissue swelling. Moderate to severe degenerative change of the knee, worse within the lateral compartment with joint space loss, subchondral sclerosis and osteophytosis. There is spurring of the tibial spines. No definite knee joint effusion. Scattered adjacent vascular calcifications. No radiopaque foreign body. IMPRESSION: 1. Acute obliquely oriented, displaced and foreshortened fracture of the distal femoral diaphysis without definitive intra-articular extension. 2. Moderate to severe degenerative change of the knee, worse within the lateral compartment.  Electronically Signed   By: Simonne Come M.D.   On: 02/28/2021 10:58   CT Head Wo Contrast  Result Date: 02/26/2021 CLINICAL DATA:  Fall, head injury EXAM: CT HEAD WITHOUT CONTRAST CT CERVICAL SPINE WITHOUT CONTRAST TECHNIQUE: Multidetector CT imaging of the head and cervical spine was performed following the standard protocol without intravenous contrast. Multiplanar CT image reconstructions of the cervical spine were also generated. COMPARISON:  CT head 03/07/2019 FINDINGS: CT HEAD FINDINGS Brain: Mild atrophy. Patchy white matter hypodensity bilaterally unchanged compatible with chronic microvascular ischemia. Negative for acute infarct, hemorrhage, mass. Vascular: Negative for hyperdense vessel Skull: Negative Sinuses/Orbits: Paranasal sinuses clear. Bilateral cataract surgery. Other: Posterior convexity scalp laceration and scalp hematoma in the midline. Gas in the subcutaneous tissues. CT CERVICAL SPINE FINDINGS Alignment: Anterolisthesis C7-T1.  Otherwise normal alignment Skull base and vertebrae: Negative for fracture Soft tissues and spinal canal: No soft tissue mass or edema. Disc levels: Disc degeneration and spurring most prominent C4-5 and C5-6 causing foraminal narrowing bilaterally. Upper chest: Apical scarring bilaterally with emphysema. Other: None IMPRESSION: 1. No acute intracranial abnormality 2. Posterior scalp laceration and hematoma 3. Negative for cervical spine fracture. Electronically Signed   By: Marlan Palau M.D.   On: 02/26/2021 14:14   CT Cervical Spine Wo Contrast  Result Date: 02/26/2021 CLINICAL DATA:  Fall, head injury EXAM: CT HEAD WITHOUT CONTRAST CT CERVICAL SPINE WITHOUT CONTRAST TECHNIQUE: Multidetector CT imaging of the head and cervical spine was performed following the standard protocol without intravenous contrast. Multiplanar CT image reconstructions of the cervical spine were also generated. COMPARISON:  CT head 03/07/2019 FINDINGS: CT HEAD FINDINGS Brain: Mild  atrophy. Patchy white matter hypodensity bilaterally unchanged compatible with chronic microvascular ischemia. Negative for acute infarct, hemorrhage, mass. Vascular: Negative for hyperdense vessel Skull: Negative Sinuses/Orbits: Paranasal sinuses clear. Bilateral cataract surgery. Other: Posterior convexity scalp laceration and scalp hematoma in the midline. Gas in the subcutaneous tissues. CT CERVICAL SPINE FINDINGS Alignment: Anterolisthesis C7-T1.  Otherwise normal alignment Skull base and vertebrae: Negative for fracture Soft tissues and spinal canal: No soft tissue mass or edema. Disc levels: Disc degeneration and spurring most prominent C4-5 and C5-6 causing foraminal narrowing bilaterally. Upper chest: Apical scarring bilaterally with emphysema. Other: None IMPRESSION: 1. No acute intracranial abnormality 2. Posterior scalp laceration and hematoma 3. Negative for cervical spine fracture. Electronically Signed   By: Marlan Palau M.D.   On: 02/26/2021 14:14   DG CHEST PORT 1 VIEW  Result Date: 02/28/2021 CLINICAL DATA:  Preop evaluation, fracture right femur EXAM: PORTABLE CHEST 1 VIEW COMPARISON:  03/07/2019 FINDINGS: Transverse diameter of heart is increased. There is soft tissue fullness in the retrocardiac region suggesting fixed hiatal hernia. There are no signs of alveolar pulmonary edema or focal pulmonary consolidation. There is subtle increased density in the lateral aspect of left lower lung fields. There is no significant pleural effusion or pneumothorax. IMPRESSION: Cardiomegaly. There are no signs of pulmonary edema or focal pulmonary consolidation. Subtle increased  density in the lateral aspect of left lower lung fields may suggest scarring or subsegmental atelectasis. Large fixed hiatal hernia. Electronically Signed   By: Ernie Avena M.D.   On: 02/28/2021 13:34   DG Femur Min 2 Views Right  Result Date: 02/28/2021 CLINICAL DATA:  Post fall, now with right leg pain. EXAM:  RIGHT FEMUR 2 VIEWS COMPARISON:  Right knee radiographs-earlier same day FINDINGS: There is an acute obliquely oriented, displaced and foreshortened fracture of the distal femoral diaphysis. No intra-articular extension. Expected adjacent soft tissue swelling. Moderate to severe degenerative change of the knee, worse within the lateral compartment with joint space loss, subchondral sclerosis and osteophytosis. There is spurring of the tibial spines. No definite knee joint effusion. Scattered adjacent vascular calcifications. No radiopaque foreign body. IMPRESSION: 1. Acute obliquely oriented, displaced and foreshortened fracture of the distal femoral diaphysis without definitive intra-articular extension. 2. Moderate to severe degenerative change of the knee, worse within the lateral compartment. Electronically Signed   By: Simonne Come M.D.   On: 02/28/2021 10:57    All pertinent xrays, MRI, CT independently reviewed and interpreted  Positive ROS: All other systems have been reviewed and were otherwise negative with the exception of those mentioned in the HPI and as above.  Physical Exam: General: No acute distress Cardiovascular: No pedal edema Respiratory: No cyanosis, no use of accessory musculature GI: No organomegaly, abdomen is soft and non-tender Skin: No lesions in the area of chief complaint Neurologic: Sensation intact distally Psychiatric: Patient is at baseline mood and affect Lymphatic: No axillary or cervical lymphadenopathy  MUSCULOSKELETAL:  - pain with minimal movement of the extremity - skin intact but bruised and proximal fracture fragment palpable underneath skin without any impending compromise to the skin - NVI distally - compartments soft  Assessment: Right distal femur fracture  Plan: - surgical treatment is recommended for pain relief, quality of life and early mobilization - patient and family are aware of r/b/a and wish to proceed, informed consent obtained -  medical optimization per primary team - surgery is planned for this afternoon - plan for retrograde IM nail - anticipate will need SNF   Thank you for the consult and the opportunity to see Ms. Colleen Garrison. Glee Arvin, MD [facility] 1:57 PM

## 2021-02-28 NOTE — Transfer of Care (Signed)
Immediate Anesthesia Transfer of Care Note  Patient: Colleen Garrison  Procedure(s) Performed: INTRAMEDULLARY (IM) RETROGRADE FEMORAL NAILING (Right)  Patient Location: PACU  Anesthesia Type:General  Level of Consciousness: drowsy  Airway & Oxygen Therapy: Patient Spontanous Breathing and Patient connected to face mask oxygen  Post-op Assessment: Report given to RN and Post -op Vital signs reviewed and stable  Post vital signs: Reviewed and stable  Last Vitals:  Vitals Value Taken Time  BP 128/56 02/28/21 2210  Temp    Pulse 75 02/28/21 2213  Resp 14 02/28/21 2213  SpO2 100 % 02/28/21 2213  Vitals shown include unvalidated device data.  Last Pain:  Vitals:   02/28/21 1730  TempSrc:   PainSc: Asleep      Patients Stated Pain Goal: 5 (02/28/21 1700)  Complications: No notable events documented.

## 2021-02-28 NOTE — Assessment & Plan Note (Signed)
Still living in independent living. No oriented to date, but knows self/place/family and able to answer questions Continue aricept and namenda when PO Delirium and fall precautions

## 2021-02-28 NOTE — ED Provider Notes (Signed)
[facility] EMERGENCY DEPARTMENT Provider Note   CSN: [id number] Arrival date & time: 02/28/21  1007     History Chief Complaint  Patient presents with   Marletta Lor    Colleen Garrison is a 87 y.o. female.  Pt presents to the ED today with a fall and right knee pain.  Pt was getting out of bed and missed the step stool.  She fell directly onto her right knee.  She denies any other injuries.  She is not on blood thinners.  She did fall and hit her head on 12/23 and required 2 staples to be placed.  CT head neg.  She denies hitting her head with this fall.      Past Medical History:  Diagnosis Date   Depression    Hiatal hernia    Memory loss    Thyroid disease     Patient Active Problem List   Diagnosis Date Noted   Acute metabolic encephalopathy 03/08/2019   Nausea 03/08/2019   Dementia (HCC) 03/08/2019   Lab test positive for detection of COVID-19 virus 03/08/2019   Generalized weakness 03/07/2019   Acute hypoxemic respiratory failure due to severe acute respiratory syndrome coronavirus 2 (SARS-CoV-2) disease (HCC) 02/28/2019   Hypothyroidism 02/28/2019   Depression    COVID-19 virus infection 02/27/2019    History reviewed. No pertinent surgical history.   OB History   No obstetric history on file.     History reviewed. No pertinent family history.  Social History   Tobacco Use   Smoking status: Former   Smokeless tobacco: Never  Building services engineer Use: Never used  Substance Use Topics   Alcohol use: No   Drug use: Never    Home Medications Prior to Admission medications   Medication Sig Start Date End Date Taking? Authorizing Provider  acetaminophen (TYLENOL) 500 MG tablet Take 500 mg by mouth every 6 (six) hours as needed for mild pain, fever or headache.   Yes [provider]  ascorbic acid (VITAMIN C) 500 MG tablet Take 1 tablet (500 mg total) by mouth daily. 03/11/19  Yes Rolly Salter, MD  b complex vitamins capsule  Take 1 capsule by mouth daily.    Yes [provider]  BREO ELLIPTA 100-25 MCG/ACT AEPB Inhale 1 puff into the lungs daily. 01/02/21  Yes [provider]  Cholecalciferol 25 MCG (1000 UT) tablet Take 1,000 Units by mouth daily.    Yes [provider]  donepezil (ARICEPT) 10 MG tablet Take 10 mg by mouth daily. 02/03/21  Yes [provider]  escitalopram (LEXAPRO) 20 MG tablet Take 20 mg by mouth daily. 01/26/21  Yes [provider]  levothyroxine (SYNTHROID) 75 MCG tablet Take 75 mcg by mouth daily. 12/22/20  Yes [provider]  memantine (NAMENDA) 10 MG tablet Take 10 mg by mouth 2 (two) times daily. 01/12/21  Yes [provider]  Omega-3 Fatty Acids (OMEGA-3 FISH OIL PO) Take 1 capsule by mouth daily.    Yes [provider]  traZODone (DESYREL) 50 MG tablet Take 50 mg by mouth at bedtime. 01/12/21  Yes [provider]  VITAMIN K PO Take 1 tablet by mouth daily.   Yes [provider]  zinc sulfate 220 (50 Zn) MG capsule Take 1 capsule (220 mg total) by mouth daily. Patient not taking: Reported on 02/28/2021 03/11/19   Rolly Salter, MD    Allergies    Codeine  Review of Systems  Review of Systems  Musculoskeletal:        Right knee pain  All other systems reviewed and are negative.  Physical Exam Updated Vital Signs BP (!) 88/57    Pulse (!) 57    Temp (!) 97.5 F (36.4 C) (Oral)    Resp 14    Ht 5\' 7"  (1.702 m)    Wt 77.1 kg    SpO2 96%    BMI 26.63 kg/m   Physical Exam Vitals and nursing note reviewed.  Constitutional:      Appearance: Normal appearance.  HENT:     Head: Normocephalic.     Comments: Contusion to posterior scalp with a healing wound and 2 staples.    Right Ear: External ear normal.     Left Ear: External ear normal.     Nose: Nose normal.     Mouth/Throat:     Mouth: Mucous membranes are moist.  Eyes:     Extraocular Movements: Extraocular movements intact.      Conjunctiva/sclera: Conjunctivae normal.     Pupils: Pupils are equal, round, and reactive to light.  Cardiovascular:     Rate and Rhythm: Normal rate and regular rhythm.     Pulses: Normal pulses.     Heart sounds: Normal heart sounds.  Pulmonary:     Effort: Pulmonary effort is normal.     Breath sounds: Normal breath sounds.  Abdominal:     General: Abdomen is flat. Bowel sounds are normal.     Palpations: Abdomen is soft.  Musculoskeletal:     Cervical back: Normal range of motion and neck supple.     Right knee: Deformity and effusion present. Decreased range of motion. Tenderness present.  Skin:    General: Skin is warm.     Capillary Refill: Capillary refill takes less than 2 seconds.  Neurological:     General: No focal deficit present.     Mental Status: She is alert and oriented to person, place, and time.  Psychiatric:        Mood and Affect: Mood normal.        Behavior: Behavior normal.    ED Results / Procedures / Treatments   Labs (all labs ordered are listed, but only abnormal results are displayed) Labs Reviewed  BASIC METABOLIC PANEL - Abnormal; Notable for the following components:      Result Value   Potassium 3.3 (*)    Glucose, Bld 139 (*)    Creatinine, Ser 1.50 (*)    Calcium 8.6 (*)    GFR, Estimated 34 (*)    All other components within normal limits  CBC WITH DIFFERENTIAL/PLATELET - Abnormal; Notable for the following components:   WBC 18.1 (*)    Neutro Abs 15.9 (*)    Lymphs Abs 0.4 (*)    Monocytes Absolute 1.4 (*)    Abs Immature Granulocytes 0.21 (*)    All other components within normal limits  PROTIME-INR - Abnormal; Notable for the following components:   Prothrombin Time 15.4 (*)    All other components within normal limits  URINALYSIS, ROUTINE W REFLEX MICROSCOPIC  TYPE AND SCREEN    EKG EKG Interpretation  Date/Time:  Sunday February 28 2021 10:08:58 EST Ventricular Rate:  60 PR Interval:  171 QRS Duration: 93 QT  Interval:  488 QTC Calculation: 488 R Axis:   82 Text Interpretation: Sinus rhythm Borderline right axis deviation Borderline prolonged QT interval No significant change since last tracing Confirmed by [id number] (  [id number]) on 02/28/2021 10:13:56 AM  Radiology DG Knee 1-2 Views Right  Result Date: 02/28/2021 CLINICAL DATA:  Post fall, now with right knee pain. EXAM: RIGHT KNEE - 1-2 VIEW COMPARISON:  Right femur radiographs-earlier same day FINDINGS: There is an acute obliquely oriented, displaced and foreshortened fracture of the distal femoral diaphysis. No intra-articular extension. Expected adjacent soft tissue swelling. Moderate to severe degenerative change of the knee, worse within the lateral compartment with joint space loss, subchondral sclerosis and osteophytosis. There is spurring of the tibial spines. No definite knee joint effusion. Scattered adjacent vascular calcifications. No radiopaque foreign body. IMPRESSION: 1. Acute obliquely oriented, displaced and foreshortened fracture of the distal femoral diaphysis without definitive intra-articular extension. 2. Moderate to severe degenerative change of the knee, worse within the lateral compartment. Electronically Signed   By: Simonne Come M.D.   On: 02/28/2021 10:58   CT Head Wo Contrast  Result Date: 02/26/2021 CLINICAL DATA:  Fall, head injury EXAM: CT HEAD WITHOUT CONTRAST CT CERVICAL SPINE WITHOUT CONTRAST TECHNIQUE: Multidetector CT imaging of the head and cervical spine was performed following the standard protocol without intravenous contrast. Multiplanar CT image reconstructions of the cervical spine were also generated. COMPARISON:  CT head 03/07/2019 FINDINGS: CT HEAD FINDINGS Brain: Mild atrophy. Patchy white matter hypodensity bilaterally unchanged compatible with chronic microvascular ischemia. Negative for acute infarct, hemorrhage, mass. Vascular: Negative for hyperdense vessel Skull: Negative Sinuses/Orbits: Paranasal  sinuses clear. Bilateral cataract surgery. Other: Posterior convexity scalp laceration and scalp hematoma in the midline. Gas in the subcutaneous tissues. CT CERVICAL SPINE FINDINGS Alignment: Anterolisthesis C7-T1.  Otherwise normal alignment Skull base and vertebrae: Negative for fracture Soft tissues and spinal canal: No soft tissue mass or edema. Disc levels: Disc degeneration and spurring most prominent C4-5 and C5-6 causing foraminal narrowing bilaterally. Upper chest: Apical scarring bilaterally with emphysema. Other: None IMPRESSION: 1. No acute intracranial abnormality 2. Posterior scalp laceration and hematoma 3. Negative for cervical spine fracture. Electronically Signed   By: Marlan Palau M.D.   On: 02/26/2021 14:14   CT Cervical Spine Wo Contrast  Result Date: 02/26/2021 CLINICAL DATA:  Fall, head injury EXAM: CT HEAD WITHOUT CONTRAST CT CERVICAL SPINE WITHOUT CONTRAST TECHNIQUE: Multidetector CT imaging of the head and cervical spine was performed following the standard protocol without intravenous contrast. Multiplanar CT image reconstructions of the cervical spine were also generated. COMPARISON:  CT head 03/07/2019 FINDINGS: CT HEAD FINDINGS Brain: Mild atrophy. Patchy white matter hypodensity bilaterally unchanged compatible with chronic microvascular ischemia. Negative for acute infarct, hemorrhage, mass. Vascular: Negative for hyperdense vessel Skull: Negative Sinuses/Orbits: Paranasal sinuses clear. Bilateral cataract surgery. Other: Posterior convexity scalp laceration and scalp hematoma in the midline. Gas in the subcutaneous tissues. CT CERVICAL SPINE FINDINGS Alignment: Anterolisthesis C7-T1.  Otherwise normal alignment Skull base and vertebrae: Negative for fracture Soft tissues and spinal canal: No soft tissue mass or edema. Disc levels: Disc degeneration and spurring most prominent C4-5 and C5-6 causing foraminal narrowing bilaterally. Upper chest: Apical scarring bilaterally with  emphysema. Other: None IMPRESSION: 1. No acute intracranial abnormality 2. Posterior scalp laceration and hematoma 3. Negative for cervical spine fracture. Electronically Signed   By: Marlan Palau M.D.   On: 02/26/2021 14:14   DG Femur Min 2 Views Right  Result Date: 02/28/2021 CLINICAL DATA:  Post fall, now with right leg pain. EXAM: RIGHT FEMUR 2 VIEWS COMPARISON:  Right knee radiographs-earlier same day FINDINGS: There is an acute obliquely oriented, displaced and foreshortened fracture of the  distal femoral diaphysis. No intra-articular extension. Expected adjacent soft tissue swelling. Moderate to severe degenerative change of the knee, worse within the lateral compartment with joint space loss, subchondral sclerosis and osteophytosis. There is spurring of the tibial spines. No definite knee joint effusion. Scattered adjacent vascular calcifications. No radiopaque foreign body. IMPRESSION: 1. Acute obliquely oriented, displaced and foreshortened fracture of the distal femoral diaphysis without definitive intra-articular extension. 2. Moderate to severe degenerative change of the knee, worse within the lateral compartment. Electronically Signed   By: Simonne Come M.D.   On: 02/28/2021 10:57    Procedures Procedures   Medications Ordered in ED Medications  fentaNYL (SUBLIMAZE) injection 50 mcg (50 mcg Intravenous Given 02/28/21 1105)  ondansetron (ZOFRAN) injection 4 mg (4 mg Intravenous Given 02/28/21 1104)  morphine 4 MG/ML injection 4 mg (4 mg Intravenous Given 02/28/21 1218)  sodium chloride 0.9 % bolus 500 mL (500 mLs Intravenous New Bag/Given 02/28/21 1241)  LORazepam (ATIVAN) injection 0.5 mg (0.5 mg Intravenous Given 02/28/21 1242)    ED Course  I have reviewed the triage vital signs and the nursing notes.  Pertinent labs & imaging results that were available during my care of the patient were reviewed by me and considered in my medical decision making (see chart for details).     MDM Rules/Calculators/A&P                         Pt d/w ortho (Dr. Roda Shutters) who recommends admission to medicine.  If she can be cleared medically, she can go to the OR today.  Pt d/w Dr. Artis Flock (triad) who will admit.   Final Clinical Impression(s) / ED Diagnoses Final diagnoses:  Fall, initial encounter  Closed fracture of distal end of right femur, unspecified fracture morphology, initial encounter [facility])    Rx / DC Orders ED Discharge Orders     None        Jacalyn Lefevre, MD 02/28/21 1250

## 2021-02-28 NOTE — Anesthesia Preprocedure Evaluation (Addendum)
Anesthesia Evaluation  Patient identified by MRN, date of birth, ID band Patient awake    Reviewed: Allergy & Precautions, NPO status , Patient's Chart, lab work & pertinent test results  Airway Mallampati: II  TM Distance: >3 FB Neck ROM: Full    Dental  (+) Dental Advisory Given, Edentulous Upper, Edentulous Lower   Pulmonary COPD,  COPD inhaler, former smoker,    breath sounds clear to auscultation       Cardiovascular negative cardio ROS   Rhythm:Regular Rate:Normal     Neuro/Psych PSYCHIATRIC DISORDERS Depression Dementia negative neurological ROS     GI/Hepatic Neg liver ROS, hiatal hernia,   Endo/Other  Hypothyroidism   Renal/GU Renal InsufficiencyRenal disease     Musculoskeletal negative musculoskeletal ROS (+)   Abdominal   Peds  Hematology negative hematology ROS (+)   Anesthesia Other Findings   Reproductive/Obstetrics                            Anesthesia Physical Anesthesia Plan  ASA: 3  Anesthesia Plan: General   Post-op Pain Management:    Induction: Intravenous  PONV Risk Score and Plan: 4 or greater and Ondansetron, Treatment may vary due to age or medical condition and Dexamethasone  Airway Management Planned: Oral ETT  Additional Equipment: None  Intra-op Plan:   Post-operative Plan: Extubation in OR  Informed Consent: I have reviewed the patients History and Physical, chart, labs and discussed the procedure including the risks, benefits and alternatives for the proposed anesthesia with the patient or authorized representative who has indicated his/her understanding and acceptance.   Patient has DNR.  Discussed DNR with power of attorney and Suspend DNR.   Dental advisory given  Plan Discussed with: CRNA  Anesthesia Plan Comments: (Lab Results      Component                Value               Date                      WBC                      18.1  (H)            02/28/2021                HGB                      12.6                02/28/2021                HCT                      37.8                02/28/2021                MCV                      95.0                02/28/2021                PLT  231                 02/28/2021           )       Anesthesia Quick Evaluation

## 2021-02-28 NOTE — Assessment & Plan Note (Addendum)
TSH checked 02/23/21 and 6.79 Followed by PCP, continue current dose of synthroid

## 2021-02-28 NOTE — Discharge Instructions (Signed)
° ° °  1. Change dressings as needed °2. May shower but keep incisions covered and dry °3. Take lovenox to prevent blood clots °4. Take stool softeners as needed °5. Take pain meds as needed ° °

## 2021-02-28 NOTE — Assessment & Plan Note (Addendum)
Stable, per pulm visit in 11/2020 she hasn't used oxygen in over a year.  Confirmed with family

## 2021-02-28 NOTE — Assessment & Plan Note (Signed)
Check magnesium Replete via IV [id number].  Recheck in AM

## 2021-02-28 NOTE — Progress Notes (Signed)
I have posted patient for surgery this afternoon pending medical clearance.  Please keep NPO and hold all anticoagulation.    Mayra Reel, MD [facility] 12:33 PM

## 2021-02-28 NOTE — Assessment & Plan Note (Addendum)
Continue lexapro 20 mg daily   

## 2021-02-28 NOTE — Op Note (Signed)
° °  Date of Surgery: 02/28/2021  INDICATIONS: Ms. Schmuck is a 87 y.o.-year-old female with a spiral right supracondylar femur fracture.  The Patient did consent to the procedure after discussion of the risks and benefits.  PREOPERATIVE DIAGNOSIS: Right spiral supracondylar femur fracture  POSTOPERATIVE DIAGNOSIS: Same.  PROCEDURE: Retrograde intramedullary nail of right supracondylar femur fracture  SURGEON: N. Glee Arvin, M.D.  ASSIST: None  ANESTHESIA:  general  IV FLUIDS AND URINE: See anesthesia.  ESTIMATED BLOOD LOSS: 200 mL.  IMPLANTS: Biomet retrograde nail 10.5 mm x 360 mm  DRAINS: None  COMPLICATIONS: see description of procedure.  DESCRIPTION OF PROCEDURE: The patient was brought to the operating room.  The patient had been signed prior to the procedure and this was documented. The patient had the anesthesia placed by the anesthesiologist.  A time-out was performed to confirm that this was the correct patient, site, side and location. The patient did receive antibiotics prior to the incision and was re-dosed during the procedure as needed at indicated intervals.  The patient had the operative extremity prepped and draped in the standard surgical fashion.    A longitudinal incision was made directly over the patella tendon.  Dissection was carried down to the peritenon which was elevated off of the tendon.  The patella tendon was split in line with its fibers.  The fat pad was removed.  Fluoroscopy was then brought in and with the use of a radiolucent triangle and traction, fracture reduction was achieved.  We then advanced the guidepin into the femoral notch pin to the femoral canal.  Opening reamer was then used to gain entry into the medullary canal.  Reducer was then advanced up the medullary canal and across the fracture.  Guide wire was then advanced into the medullary canal up into the proximal femur to the proper depth.  Nail length was then measured.  Sequential reaming  was performed to a 12 reamer with adequate chatter.  A 10.5 mm nail was advanced over the guidewire to the proper depth using fluoroscopic guidance.  4 distal interlocking screws were placed through the jig and then the setscrew was locked.  2 proximal interlocking screws were placed using perfect circle technique.  Final x-rays were taken.  The surgical sites were thoroughly irrigated and closed in layered fashion.  Sterile dressings were applied.  Patient tolerated the procedure well had no many complications.  POSTOPERATIVE PLAN: Patient will be nonweightbearing to the right lower extremity.  She can mobilize in the morning with physical therapy.  She will need SNF placement.  She may begin hip and knee range of motion immediately as tolerated.  Mayra Reel, MD 10:02 PM

## 2021-02-28 NOTE — Assessment & Plan Note (Signed)
Likely reactive as she has no signs or symptoms of infection.  Checking CXR and UA not yet collected Continue to trend and follow fever curve

## 2021-02-28 NOTE — Assessment & Plan Note (Signed)
Continue trazodone 

## 2021-02-28 NOTE — Anesthesia Procedure Notes (Signed)
Procedure Name: Intubation Date/Time: 02/28/2021 8:13 PM Performed by: Claudina Lick, CRNA Pre-anesthesia Checklist: Patient identified, Emergency Drugs available, Suction available and Patient being monitored Patient Re-evaluated:Patient Re-evaluated prior to induction Oxygen Delivery Method: Circle system utilized Preoxygenation: Pre-oxygenation with 100% oxygen Induction Type: IV induction Ventilation: Mask ventilation without difficulty Laryngoscope Size: Miller and 2 Grade View: Grade I Tube type: Oral Tube size: 7.5 mm Number of attempts: 1 Airway Equipment and Method: Stylet Placement Confirmation: ETT inserted through vocal cords under direct vision, positive ETCO2 and breath sounds checked- equal and bilateral Secured at: 22 cm Tube secured with: Tape Dental Injury: Teeth and Oropharynx as per pre-operative assessment

## 2021-03-01 ENCOUNTER — Inpatient Hospital Stay (HOSPITAL_COMMUNITY): Payer: Medicare HMO

## 2021-03-01 DIAGNOSIS — N179 Acute kidney failure, unspecified: Secondary | ICD-10-CM | POA: Diagnosis not present

## 2021-03-01 DIAGNOSIS — G301 Alzheimer's disease with late onset: Secondary | ICD-10-CM | POA: Diagnosis not present

## 2021-03-01 DIAGNOSIS — E663 Overweight: Secondary | ICD-10-CM | POA: Diagnosis present

## 2021-03-01 DIAGNOSIS — J9611 Chronic respiratory failure with hypoxia: Secondary | ICD-10-CM

## 2021-03-01 DIAGNOSIS — S72401A Unspecified fracture of lower end of right femur, initial encounter for closed fracture: Secondary | ICD-10-CM | POA: Diagnosis not present

## 2021-03-01 DIAGNOSIS — M6282 Rhabdomyolysis: Secondary | ICD-10-CM | POA: Diagnosis present

## 2021-03-01 DIAGNOSIS — T796XXA Traumatic ischemia of muscle, initial encounter: Secondary | ICD-10-CM

## 2021-03-01 DIAGNOSIS — N1831 Chronic kidney disease, stage 3a: Secondary | ICD-10-CM

## 2021-03-01 DIAGNOSIS — F02B0 Dementia in other diseases classified elsewhere, moderate, without behavioral disturbance, psychotic disturbance, mood disturbance, and anxiety: Secondary | ICD-10-CM

## 2021-03-01 LAB — BASIC METABOLIC PANEL
Anion gap: 6 (ref 5–15)
BUN: 26 mg/dL — ABNORMAL HIGH (ref 8–23)
CO2: 25 mmol/L (ref 22–32)
Calcium: 8 mg/dL — ABNORMAL LOW (ref 8.9–10.3)
Chloride: 103 mmol/L (ref 98–111)
Creatinine, Ser: 1.28 mg/dL — ABNORMAL HIGH (ref 0.44–1.00)
GFR, Estimated: 41 mL/min — ABNORMAL LOW (ref >60)
Glucose, Bld: 131 mg/dL — ABNORMAL HIGH (ref 70–99)
Potassium: 3.7 mmol/L (ref 3.5–5.1)
Sodium: 134 mmol/L — ABNORMAL LOW (ref 135–145)

## 2021-03-01 LAB — CBC
HCT: 33.8 % — ABNORMAL LOW (ref 36.0–46.0)
Hemoglobin: 10.9 g/dL — ABNORMAL LOW (ref 12.0–15.0)
MCH: 31 pg (ref 26.0–34.0)
MCHC: 32.2 g/dL (ref 30.0–36.0)
MCV: 96 fL (ref 80.0–100.0)
Platelets: 224 10*3/uL (ref 150–400)
RBC: 3.52 MIL/uL — ABNORMAL LOW (ref 3.87–5.11)
RDW: 12.7 % (ref 11.5–15.5)
WBC: 17.1 10*3/uL — ABNORMAL HIGH (ref 4.0–10.5)
nRBC: 0 % (ref 0.0–0.2)

## 2021-03-01 MED ORDER — ADULT MULTIVITAMIN W/MINERALS CH
1.0000 | ORAL_TABLET | Freq: Every day | ORAL | Status: DC
  Administered 2021-03-01 – 2021-03-04 (×4): 1 via ORAL
  Filled 2021-03-01 (×4): qty 1

## 2021-03-01 MED ORDER — ENSURE ENLIVE PO LIQD
237.0000 mL | Freq: Two times a day (BID) | ORAL | Status: DC
  Administered 2021-03-01 – 2021-03-04 (×6): 237 mL via ORAL

## 2021-03-01 NOTE — Assessment & Plan Note (Signed)
Looks to be resolved.  Follow-up EKG on 12/26 notes a normal QT.  Monitor closely, try to avoid inducing medications including Zofran.

## 2021-03-01 NOTE — Assessment & Plan Note (Addendum)
Status post IM nail.  PT and OT recommended skilled nursing.

## 2021-03-01 NOTE — Assessment & Plan Note (Signed)
Patient meets criteria BMI greater than 25 

## 2021-03-01 NOTE — Assessment & Plan Note (Signed)
Continue Synthroid °

## 2021-03-01 NOTE — Progress Notes (Signed)
PT Cancellation Note  Patient Details Name: Colleen Garrison MRN: [id number] DOB: 1931/12/06   Cancelled Treatment:    Reason Eval/Treat Not Completed: Patient at procedure or test/unavailable. Transport arriving to take pt to x-ray. PT will continue to f/u with pt acutely as available and appropriate.    Alessandra Bevels Reynolds Kittel 03/01/2021, 8:04 AM

## 2021-03-01 NOTE — Assessment & Plan Note (Signed)
Replacing as needed 

## 2021-03-01 NOTE — Evaluation (Signed)
Physical Therapy Evaluation Patient Details Name: Colleen Garrison MRN: [id number] DOB: [birth year] Today's Date: 03/01/2021  History of Present Illness  Pt is an 87 y/o female admitted after falling out of bed in which she sustained a R distal femur fx. She is s/p IM nail, NWB R LE. PMH including but not limited mild dementia.   Clinical Impression  Pt presented supine in bed with HOB elevated, awake and willing to participate in therapy session. Pt's son and daughter-in-law present throughout session. Prior to admission, pt reported that she was very independent with all functional mobility and ADLs. Pt lives alone in an ILF. At the time of evaluation, she was greatly limited in mobility secondary to increased pain. She was able to perform bed mobility with max A and sit<>stand transfer with use of RW and mod A. She tolerated standing with RW for ~1 minute prior to fatiguing. She also required assistance to maintain NWB'ing R LE throughout. Pt would continue to benefit from skilled physical therapy services at this time while admitted and after d/c to address the below listed limitations in order to improve overall safety and independence with functional mobility.        Recommendations for follow up therapy are one component of a multi-disciplinary discharge planning process, led by the attending physician.  Recommendations may be updated based on patient status, additional functional criteria and insurance authorization.  Follow Up Recommendations Skilled nursing-short term rehab (<3 hours/day)    Assistance Recommended at Discharge Frequent or constant Supervision/Assistance  Functional Status Assessment Patient has had a recent decline in their functional status and demonstrates the ability to make significant improvements in function in a reasonable and predictable amount of time.  Equipment Recommendations  Other (comment) (defer to next venue of care)    Recommendations for Other  Services       Precautions / Restrictions Precautions Precautions: Fall Restrictions Weight Bearing Restrictions: Yes RLE Weight Bearing: Non weight bearing      Mobility  Bed Mobility Overal bed mobility: Needs Assistance Bed Mobility: Supine to Sit;Sit to Supine     Supine to sit: Max assist Sit to supine: Max assist   General bed mobility comments: increased time and effort, use of bed rails, verbal and tactile cueing for sequencing, heavy physical assistance needed for movement of R LE onto and off of bed, as well as for trunk elevation to achieve an upright sitting position    Transfers Overall transfer level: Needs assistance Equipment used: Rolling walker (2 wheels) Transfers: Sit to/from Stand Sit to Stand: Mod assist;From elevated surface           General transfer comment: increased time and effort, bed slightly elevated, PT propping pt's R foot up to assist with maintaining NWB'ing R LE throughout, mod A needed to power up into a rull upright standing position    Ambulation/Gait               General Gait Details: unable  Stairs            Wheelchair Mobility    Modified Rankin (Stroke Patients Only)       Balance Overall balance assessment: Needs assistance Sitting-balance support: Feet supported Sitting balance-Leahy Scale: Fair     Standing balance support: Bilateral upper extremity supported Standing balance-Leahy Scale: Poor                               Pertinent Vitals/Pain  Pain Assessment: Faces Faces Pain Scale: Hurts whole lot Pain Location: R LE Pain Descriptors / Indicators: Grimacing;Guarding Pain Intervention(s): Monitored during session;Repositioned    Home Living Family/patient expects to be discharged to:: Private residence Living Arrangements: Alone Available Help at Discharge: Family;Friend(s) Type of Home: Independent living facility Home Access: Level entry       Home Layout: One  level Home Equipment: Agricultural consultant (2 wheels)      Prior Function Prior Level of Function : Independent/Modified Independent                     Hand Dominance        Extremity/Trunk Assessment   Upper Extremity Assessment Upper Extremity Assessment: Defer to OT evaluation    Lower Extremity Assessment Lower Extremity Assessment: RLE deficits/detail RLE Deficits / Details: pt with decreased strength and ROM limitations secondary to post-op pain and weakness. RLE: Unable to fully assess due to pain    Cervical / Trunk Assessment Cervical / Trunk Assessment: Kyphotic  Communication   Communication: No difficulties  Cognition Arousal/Alertness: Awake/alert Behavior During Therapy: WFL for tasks assessed/performed Overall Cognitive Status: History of cognitive impairments - at baseline Area of Impairment: Following commands;Problem solving                       Following Commands: Follows one step commands consistently;Follows one step commands with increased time     Problem Solving: Difficulty sequencing;Requires verbal cues;Requires tactile cues          General Comments      Exercises     Assessment/Plan    PT Assessment Patient needs continued PT services  PT Problem List Decreased strength;Decreased range of motion;Decreased activity tolerance;Decreased balance;Decreased mobility;Decreased coordination;Decreased knowledge of use of DME;Decreased safety awareness;Decreased knowledge of precautions;Pain       PT Treatment Interventions DME instruction;Gait training;Functional mobility training;Therapeutic activities;Therapeutic exercise;Balance training;Neuromuscular re-education;Patient/family education;Wheelchair mobility training    PT Goals (Current goals can be found in the Care Plan section)  Acute Rehab PT Goals Patient Stated Goal: decrease pain PT Goal Formulation: With patient/family Time For Goal Achievement:  03/15/21 Potential to Achieve Goals: Good    Frequency Min 3X/week   Barriers to discharge        Co-evaluation               AM-PAC PT "6 Clicks" Mobility  Outcome Measure Help needed turning from your back to your side while in a flat bed without using bedrails?: A Lot Help needed moving from lying on your back to sitting on the side of a flat bed without using bedrails?: A Lot Help needed moving to and from a bed to a chair (including a wheelchair)?: A Lot Help needed standing up from a chair using your arms (e.g., wheelchair or bedside chair)?: A Lot Help needed to walk in hospital room?: Total Help needed climbing 3-5 steps with a railing? : Total 6 Click Score: 10    End of Session Equipment Utilized During Treatment: Gait belt Activity Tolerance: Patient limited by pain Patient left: in bed;with call bell/phone within reach;with family/visitor present;with SCD's reapplied Nurse Communication: Mobility status PT Visit Diagnosis: Other abnormalities of gait and mobility (R26.89);Pain Pain - Right/Left: Right Pain - part of body: Leg    Time: 8527-7824 PT Time Calculation (min) (ACUTE ONLY): 25 min   Charges:   PT Evaluation $PT Eval Moderate Complexity: 1 Mod PT Treatments $Therapeutic Activity: 8-22 mins  Arletta Bale, DPT  Acute Rehabilitation Services Office [phone]   Alessandra Bevels Tarvaris Puglia 03/01/2021, 9:58 AM

## 2021-03-01 NOTE — Assessment & Plan Note (Addendum)
Treating with IV fluids.  Secondary to rhabdo.  Almost back to baseline.

## 2021-03-01 NOTE — Progress Notes (Signed)
° °  Subjective:  Patient reports pain as moderate.  A little confused this morning.  Objective:   VITALS:   Vitals:   03/01/21 0350 03/01/21 0733 03/01/21 1341 03/01/21 1611  BP: (!) 131/48 (!) 98/48 118/60 (!) 109/43  Pulse: 80 73 70 68  Resp: 16 16  16   Temp: 98.6 F (37 C) 97.8 F (36.6 C)  98.1 F (36.7 C)  TempSrc: Oral Oral    SpO2: 98% 95%  96%  Weight:      Height:        Sensation intact distally Intact pulses distally Dorsiflexion/Plantar flexion intact   Lab Results  Component Value Date   WBC 17.1 (H) 02/28/2021   HGB 10.9 (L) 02/28/2021   HCT 33.8 (L) 02/28/2021   MCV 96.0 02/28/2021   PLT 224 02/28/2021     Assessment/Plan:  1 Day Post-Op   - Expected postop acute blood loss anemia - will monitor for symptoms - Up with PT/OT - DVT ppx - SCDs, ambulation, lovenox - NWB operative extremity - Pain control - Discharge planning - anticipate will need SNF  03/02/2021 03/01/2021, 4:11 PM [phone]

## 2021-03-01 NOTE — Progress Notes (Signed)
Pt has still not voided since foley removal this morning. 276 noted on bladder scanner

## 2021-03-01 NOTE — Progress Notes (Signed)
[facility] Progress Note  Patient: Colleen Garrison    PFX:[id number]  DOA: 02/28/2021    Date of Service: the patient was seen and examined on 03/01/2021  Brief hospital course: 87 year old female from independent living with past medical history of stage IIIa chronic kidney disease, restrictive lung disease with chronic respiratory failure with hypoxia on 2 L nasal cannula, senile dementia and hypothyroidism who presented to the emergency room on morning of 12/25 after she had a mechanical fall getting out of bed. (Patient had had a recent fall 2 days prior causing a small hematoma on the back of her head.)  Patient found to have a right spiral supracondylar femur fracture and taken by orthopedic surgery on night of 12/25 for retrograde IM nail.  On admission, patient also found to have mild rhabdomyolysis with a CK of 4700.  Assessment and Plan: Respiratory Centrilobular emphysema and restrictive lung disease  Assessment & Plan Currently breathing comfortably on 2 L nasal cannula.  Denies any shortness of breath  Endocrine Hypothyroidism Assessment & Plan Continue Synthroid  Nervous and Auditory Late onset Alzheimer dementia [facility]) Assessment & Plan Forgetful and hopeful this would not affect her ability to participate in rehab.  Patient forgets that she already had surgery last night.  Musculoskeletal and Integument Rhabdomyolysis Assessment & Plan Secondary to fall and some mild period of immobility on ground before patient was discovered.  On IV fluids.  Likely cause of patient's mild AKI.  Treating with IV fluids.  Recheck CK in the morning.  * Closed fracture of distal end of right femur, unspecified fracture morphology, initial encounter [facility]) Assessment & Plan Status post IM nail.  Follow-up for acute blood loss anemia.  PT and OT recommended skilled nursing  Genitourinary Acute renal failure superimposed on stage 3a chronic kidney disease (HCC) Assessment &  Plan Treating with IV fluids.  Secondary to rhabdo.  Follow labs in the morning  Other Overweight (BMI 25.0-29.9) Assessment & Plan Patient meets criteria BMI greater than 25  Hypokalemia Assessment & Plan Replacing as needed  Depression Assessment & Plan Stable continue home medications  Leukocytosis Assessment & Plan No signs of infection.  Suspect secondary to stress margination.  Recheck labs in the morning  Prolonged QT interval Assessment & Plan Looks to be resolved.  Follow-up EKG on 12/26 notes a normal QT.  Monitor closely, try to avoid inducing medications including Zofran.    Body mass index is 26.63 kg/m.  Nutrition Problem: Increased nutrient needs Etiology: post-op healing, other (see comment) (Femur fracture)     Consultants: Orthopedic surgery  Procedures: Status post IM nail the night of 12/25  Antimicrobials: Preop Ancef  Code Status: DNR   Subjective: Patient doing okay, complains of occasional right leg soreness  Objective: Vital signs were reviewed and unremarkable. Vitals:   03/01/21 0733 03/01/21 1341  BP: (!) 98/48 118/60  Pulse: 73 70  Resp: 16   Temp: 97.8 F (36.6 C)   SpO2: 95%     Intake/Output Summary (Last 24 hours) at 03/01/2021 1459 Last data filed at 03/01/2021 0600 Gross per 24 hour  Intake 2767.79 ml  Output 1175 ml  Net 1592.79 ml   Filed Weights   02/28/21 1012  Weight: 77.1 kg   Body mass index is 26.63 kg/m.  Exam:  General: Alert and oriented x2, no acute distress HEENT: Normocephalic and atraumatic, mucous membranes slightly dry Cardiovascular: Regular rate and rhythm, S1-S2 Respiratory: Decreased breath sounds throughout Abdomen: Soft, nontender, nondistended, positive bowel  sounds Musculoskeletal: No clubbing or cyanosis, trace pitting edema Skin: No skin breaks, tears or lesions other than at site of surgery Psychiatry: Appropriate, no evidence of psychoses.  Underlying dementia Neurology:  No focal deficits  Data Reviewed: Noted magnesium level at 1.6 from previous day and replaced  Disposition:  Status is: Inpatient  Remains inpatient appropriate because: Needs skilled nursing set up after surgery  Family Communication: Son and daughter-in-law at the bedside DVT Prophylaxis: enoxaparin (LOVENOX) injection 40 mg Start: 03/01/21 1200 Sequential compression device to OR Start: 02/28/21 2307 SCDs Start: 02/28/21 2307 Place TED hose Start: 02/28/21 2307 SCDs Start: 02/28/21 1340    Author: Annita Brod ,MD 03/01/2021 2:59 PM  To reach On-call, see care teams to locate the attending and reach out via [email or web address]. Between 7PM-7AM, please contact night-coverage If you still have difficulty reaching the attending provider, please page the [facility] (Director on Call) for [facility] on amion for assistance.

## 2021-03-01 NOTE — Assessment & Plan Note (Signed)
Stable continue home medications 

## 2021-03-01 NOTE — Evaluation (Signed)
Occupational Therapy Evaluation Patient Details Name: Colleen Garrison MRN: [id number] DOB: [birth year] Today's Date: 03/01/2021   History of Present Illness Pt is an 87 y/o female admitted after falling out of bed in which she sustained a R distal femur fx. She is s/p IM nail, NWB R LE. PMH including but not limited mild dementia.   Clinical Impression   Patient is currently requiring assistance with ADLs including total assist with toileting, total assist with LE dressing, maximum assist with bed level bathing, and Min guard/setup assist with UE ADLs.  Current level of function is below patient's typical baseline at her ILF.  During this evaluation, patient was limited by generalized weakness, impaired activity tolerance, post-op pain, and memory deficits which required RT LE blocking to prevent weight bearing to RLE during transfer training, all of which has the potential to impact patient's safety and independence during functional mobility, as well as performance for ADLs.  Patient lives at an ILF, but currently requires 24/7 supervision and assistance which is unavailable at her ILF.  Patient demonstrates good rehab potential, and should benefit from continued skilled occupational therapy services while in acute care to maximize safety, independence and quality of life at home.  Continued occupational therapy services in a SNF setting prior to return home is recommended.  ?    Recommendations for follow up therapy are one component of a multi-disciplinary discharge planning process, led by the attending physician.  Recommendations may be updated based on patient status, additional functional criteria and insurance authorization.   Follow Up Recommendations  Skilled nursing-short term rehab (<3 hours/day)    Assistance Recommended at Discharge Frequent or constant Supervision/Assistance  Functional Status Assessment  Patient has had a recent decline in their functional status and  demonstrates the ability to make significant improvements in function in a reasonable and predictable amount of time.  Equipment Recommendations  Other (comment) (TBD)    Recommendations for Other Services       Precautions / Restrictions Precautions Precautions: Fall Required Braces or Orthoses: Knee Immobilizer - Right Restrictions Weight Bearing Restrictions: Yes RLE Weight Bearing: Non weight bearing Other Position/Activity Restrictions: Memory deficits for NWB. Needs frequent reminders      Mobility Bed Mobility Overal bed mobility: Needs Assistance Bed Mobility: Supine to Sit;Sit to Supine     Supine to sit: Max assist;HOB elevated Sit to supine: Max assist;HOB elevated   General bed mobility comments: increased time and effort, use of bed rails, verbal and tactile cueing for sequencing, heavy physical assistance needed for movement of R LE onto and off of bed, as well as for trunk elevation to achieve an upright sitting position    Transfers Overall transfer level: Needs assistance Equipment used: Rolling walker (2 wheels) Transfers: Sit to/from Stand Sit to Stand: Max assist;From elevated surface           General transfer comment: increased time and effort, bed slightly elevated, OT propping pt's R foot up to assist with maintaining NWB'ing R LE throughout, Max A needed to power up into a full upright standing position after 2 attetmps to stand where pt required lower to EOB before upright stand.  Pt attempting to pull RT foot to floor and needs foot blocked to maintain NWB.      Balance Overall balance assessment: Needs assistance;History of Falls Sitting-balance support: Feet supported Sitting balance-Leahy Scale: Fair     Standing balance support: Bilateral upper extremity supported Standing balance-Leahy Scale: Poor  ADL either performed or assessed with clinical judgement   ADL Overall ADL's : Needs  assistance/impaired Eating/Feeding: Independent   Grooming: Set up;Bed level   Upper Body Bathing: Set up;Min guard;Sitting   Lower Body Bathing: Maximal assistance;Sitting/lateral leans;Sit to/from stand;+2 for physical assistance   Upper Body Dressing : Min guard;Sitting   Lower Body Dressing: Total assistance;Sitting/lateral leans;+2 for physical assistance;Sit to/from Market researcher Details (indicate cue type and reason): Unable to pivot to chair or BSC. Please see Mobility for Sit<>stand. Toileting- Clothing Manipulation and Hygiene: Total assistance;Bed level       Functional mobility during ADLs: Moderate assistance;Maximal assistance;Rolling walker (2 wheels);Cueing for safety;Cueing for sequencing General ADL Comments: Frequent cues for NWB to RLE due to pt's memory impairment.     Vision Baseline Vision/History: 1 Wears glasses Ability to See in Adequate Light: 1 Impaired Patient Visual Report: No change from baseline Vision Assessment?: No apparent visual deficits     Perception Perception Perception: Within Functional Limits   Praxis Praxis Praxis: Impaired Praxis Impairment Details: Ideomotor;Initiation    Pertinent Vitals/Pain Pain Assessment: PAINAD Faces Pain Scale: Hurts whole lot Breathing: normal Negative Vocalization: occasional moan/groan, low speech, negative/disapproving quality Facial Expression: sad, frightened, frown Body Language: tense, distressed pacing, fidgeting Consolability: distracted or reassured by voice/touch PAINAD Score: 4 Pain Location: R LE Pain Descriptors / Indicators: Grimacing;Guarding;Sore Pain Intervention(s): Limited activity within patient's tolerance;Monitored during session;Ice applied;Repositioned     Hand Dominance     Extremity/Trunk Assessment Upper Extremity Assessment Upper Extremity Assessment: Generalized weakness (Grossly 4-/5)   Lower Extremity Assessment Lower Extremity Assessment: Defer  to PT evaluation   Cervical / Trunk Assessment Cervical / Trunk Assessment: Kyphotic   Communication Communication Communication: No difficulties   Cognition Arousal/Alertness: Awake/alert Behavior During Therapy: WFL for tasks assessed/performed Overall Cognitive Status: History of cognitive impairments - at baseline Area of Impairment: Following commands;Problem solving;Memory;Awareness;Safety/judgement                     Memory: Decreased recall of precautions;Decreased short-term memory Following Commands: Follows one step commands consistently;Follows one step commands with increased time Safety/Judgement: Decreased awareness of deficits Awareness: Emergent Problem Solving: Difficulty sequencing;Requires verbal cues;Requires tactile cues       General Comments       Exercises     Shoulder Instructions      Home Living Family/patient expects to be discharged to:: Skilled nursing facility   Available Help at Discharge: Family;Friend(s);Available 24 hours/day;Other (Comment) (Son in room and reports that he, his wife and sister will try and manage as close to 24/7 avialability as possible once pt home.) Type of Home: Independent living facility Home Access: Level entry     Home Layout: One level     Bathroom Shower/Tub: Walk-in shower         Home Equipment: Agricultural consultant (2 wheels);Adaptive equipment;Wheelchair - [employer] Equipment: Reacher;Long-handled shoe horn;Long-handled sponge Additional Comments: Pt in ILF.      Prior Functioning/Environment Prior Level of Function : Independent/Modified Independent;History of Falls (last six months)                        OT Problem List: Decreased strength;Pain;Decreased activity tolerance;Decreased safety awareness;Decreased knowledge of precautions;Decreased knowledge of use of DME or AE;Impaired balance (sitting and/or standing);Obesity      OT Treatment/Interventions:  Self-care/ADL training;Therapeutic exercise;Therapeutic activities;DME and/or AE instruction;Patient/family education;Balance training    OT Goals(Current goals can be found  in the care plan section) Acute Rehab OT Goals Patient Stated Goal: For pt to have necessary assistance after discharge to remain safe. OT Goal Formulation: With family Time For Goal Achievement: 03/15/21 Potential to Achieve Goals: Good ADL Goals Pt Will Perform Lower Body Dressing: sitting/lateral leans;bed level;with set-up;with supervision Pt Will Transfer to Toilet: with min guard assist;stand pivot transfer;bedside commode (With NWB compliance to RLE) Pt Will Perform Toileting - Clothing Manipulation and hygiene: with adaptive equipment;sitting/lateral leans;with supervision Pt/caregiver will Perform Home Exercise Program: Both right and left upper extremity;Increased strength;With Supervision Additional ADL Goal #1: Pt will engage in at least 10 min EOB seated functional activities with good static balance without UE support, no loss of balance, in order to demonstrate improved activity tolerance and balance needed to perform seated ADLs  and preparation for transfers safely at home.  OT Frequency: Min 2X/week   Barriers to D/C:    In ILF but alone in own apartment. Currently needs 24/7 supervision and Max As.       Co-evaluation              AM-PAC OT "6 Clicks" Daily Activity     Outcome Measure Help from another person eating meals?: None Help from another person taking care of personal grooming?: A Little Help from another person toileting, which includes using toliet, bedpan, or urinal?: Total Help from another person bathing (including washing, rinsing, drying)?: A Lot Help from another person to put on and taking off regular upper body clothing?: A Little Help from another person to put on and taking off regular lower body clothing?: Total 6 Click Score: 14   End of Session Equipment Utilized  During Treatment: Gait belt;Rolling walker (2 wheels) Nurse Communication: Mobility status  Activity Tolerance: Patient tolerated treatment well;Patient limited by pain Patient left: in bed;with call bell/phone within reach;with bed alarm set;with family/visitor present  OT Visit Diagnosis: Unsteadiness on feet (R26.81);Pain;History of falling (Z91.81);Other symptoms and signs involving cognitive function;Muscle weakness (generalized) (M62.81);Other abnormalities of gait and mobility (R26.89) Pain - Right/Left: Right Pain - part of body: Leg                Time: 2595-6387 OT Time Calculation (min): 37 min Charges:  OT General Charges $OT Visit: 1 Visit OT Evaluation $OT Eval Moderate Complexity: 1 Mod OT Treatments $Therapeutic Activity: 8-22 mins  Victorino Dike, OT Acute Rehab Services Office: [phone] 03/01/2021  Theodoro Clock 03/01/2021, 2:01 PM

## 2021-03-01 NOTE — Hospital Course (Addendum)
87 year old female from independent living with past medical history of stage IIIa chronic kidney disease, restrictive lung disease with chronic respiratory failure with hypoxia on 2 L nasal cannula, senile dementia and hypothyroidism who presented to the emergency room on morning of 12/25 after she had a mechanical fall getting out of bed. (Patient had had a recent fall 2 days prior causing a small hematoma on the back of her head.)  Patient found to have a right spiral supracondylar femur fracture and taken by orthopedic surgery on night of 12/25 for retrograde IM nail.  Looking for skilled nursing.  On admission, patient also found to have mild rhabdomyolysis with a CK of 4700, improving with IV fluids and trending downward.

## 2021-03-01 NOTE — Assessment & Plan Note (Signed)
Currently breathing comfortably on 2 L nasal cannula.  Denies any shortness of breath

## 2021-03-01 NOTE — Assessment & Plan Note (Addendum)
Likely stress margination, maybe from rhabdomyolysis.  Resolved without abx intervention.

## 2021-03-01 NOTE — Assessment & Plan Note (Addendum)
Secondary to fall and some mild period of immobility on ground before patient was discovered.  On IV fluids.  Likely cause of patient's mild AKI.  CK continues to trend downward.

## 2021-03-01 NOTE — Progress Notes (Signed)
MD aware, advised to wait for pt to void on her own at the time. In and out cath not suggested at this time

## 2021-03-01 NOTE — Progress Notes (Signed)
Initial Nutrition Assessment  DOCUMENTATION CODES:   Not applicable  INTERVENTION:   Recommend liberalizing pt diet to regular. Received ok from MD.  Encourage good PO intake Multivitamin w/ minerals daily Ensure Enlive po BID, each supplement provides 350 kcal and 20 grams of protein Make w/ assist in ordering meals.  NUTRITION DIAGNOSIS:   Increased nutrient needs related to post-op healing, other (see comment) (Femur fracture) as evidenced by estimated needs.  GOAL:   Patient will meet greater than or equal to 90% of their needs  MONITOR:   PO intake, Supplement acceptance, Weight trends, Labs  REASON FOR ASSESSMENT:   Consult Assessment of nutrition requirement/status, Hip fracture protocol  ASSESSMENT:   87 y.o. female presented to the ED after a fall out of bed. PMH includes hypothyroidism, CKD IIIa, and dementia. Pt admitted with R distal femur fracture; orthopedics took pt to the OR for surgical repair of fracture.   Pt resting in bed and family at bedside.  Pt reports that her appetite is not great this morning, but PTA her appetite was great and was eating well. Pt reports that she was taking some vitamins at home but no ONS.  Pt reports that she has not had a meal since she has been here. RD discussed that this unit is a room service and pt has to call. No menu in room, RD provided pt with menu.   Pt reports that her UBW is 140# and that she has not had any weight loss. Pt does not use any assistance when ambulating. Suspect incorrect weights within chart due to significant weight loss within 2 days.   Discussed ONS while in hospital to provide additional calories and protein to help heal; pt agreeable.  Pt and family with no other concerns or questions at this time.   Medications reviewed and include: Colace, IV antibiotics Labs reviewed: BUN 26, Creatinine 1.28, Magnesium 1.6    NUTRITION - FOCUSED PHYSICAL EXAM:  Flowsheet Row Most Recent Value   Orbital Region No depletion  Upper Arm Region No depletion  Thoracic and Lumbar Region No depletion  Buccal Region No depletion  Temple Region Mild depletion  Clavicle Bone Region Mild depletion  Clavicle and Acromion Bone Region Mild depletion  Scapular Bone Region Mild depletion  Dorsal Hand Mild depletion  Patellar Region No depletion  Anterior Thigh Region No depletion  Posterior Calf Region No depletion  Edema (RD Assessment) None  Hair Reviewed  Eyes Reviewed  Mouth Reviewed  Skin Reviewed  Nails Reviewed       Diet Order:   Diet Order             Diet Heart Room service appropriate? Yes; Fluid consistency: Thin  Diet effective now                   EDUCATION NEEDS:   No education needs have been identified at this time  Skin:  Skin Assessment: Reviewed RN Assessment  Last BM:  No Documentation  Height:   Ht Readings from Last 1 Encounters:  02/28/21 5\' 7"  (1.702 m)    Weight:   Wt Readings from Last 1 Encounters:  02/28/21 77.1 kg    Ideal Body Weight:  61.4 kg  BMI:  Body mass index is 26.63 kg/m.  Estimated Nutritional Needs:   Kcal:  1900-2100  Protein:  95-110 grams  Fluid:  >/= 1.9 L    Colleen Garrison 03/02/21, RD, LDN Clinical Dietitian See [facility] for contact information.

## 2021-03-01 NOTE — Progress Notes (Signed)
Pt's daughter would like to be called by doctor and social worker during rounds if possible.

## 2021-03-01 NOTE — Assessment & Plan Note (Addendum)
Forgetful and hopeful this would not affect her ability to participate in rehab.

## 2021-03-02 ENCOUNTER — Encounter (HOSPITAL_COMMUNITY): Payer: Self-pay | Admitting: Orthopaedic Surgery

## 2021-03-02 DIAGNOSIS — J9611 Chronic respiratory failure with hypoxia: Secondary | ICD-10-CM | POA: Diagnosis not present

## 2021-03-02 DIAGNOSIS — G301 Alzheimer's disease with late onset: Secondary | ICD-10-CM | POA: Diagnosis not present

## 2021-03-02 DIAGNOSIS — S72401A Unspecified fracture of lower end of right femur, initial encounter for closed fracture: Secondary | ICD-10-CM | POA: Diagnosis not present

## 2021-03-02 DIAGNOSIS — N179 Acute kidney failure, unspecified: Secondary | ICD-10-CM | POA: Diagnosis not present

## 2021-03-02 LAB — BASIC METABOLIC PANEL
Anion gap: 10 (ref 5–15)
BUN: 28 mg/dL — ABNORMAL HIGH (ref 8–23)
CO2: 21 mmol/L — ABNORMAL LOW (ref 22–32)
Calcium: 7.9 mg/dL — ABNORMAL LOW (ref 8.9–10.3)
Chloride: 102 mmol/L (ref 98–111)
Creatinine, Ser: 1.11 mg/dL — ABNORMAL HIGH (ref 0.44–1.00)
GFR, Estimated: 48 mL/min — ABNORMAL LOW (ref >60)
Glucose, Bld: 120 mg/dL — ABNORMAL HIGH (ref 70–99)
Potassium: 3.7 mmol/L (ref 3.5–5.1)
Sodium: 133 mmol/L — ABNORMAL LOW (ref 135–145)

## 2021-03-02 LAB — CBC
HCT: 32.1 % — ABNORMAL LOW (ref 36.0–46.0)
Hemoglobin: 10.5 g/dL — ABNORMAL LOW (ref 12.0–15.0)
MCH: 31.3 pg (ref 26.0–34.0)
MCHC: 32.7 g/dL (ref 30.0–36.0)
MCV: 95.5 fL (ref 80.0–100.0)
Platelets: 232 10*3/uL (ref 150–400)
RBC: 3.36 MIL/uL — ABNORMAL LOW (ref 3.87–5.11)
RDW: 12.7 % (ref 11.5–15.5)
WBC: 11.8 10*3/uL — ABNORMAL HIGH (ref 4.0–10.5)
nRBC: 0 % (ref 0.0–0.2)

## 2021-03-02 LAB — CK: Total CK: 2625 U/L — ABNORMAL HIGH (ref 38–234)

## 2021-03-02 MED ORDER — CHLORHEXIDINE GLUCONATE CLOTH 2 % EX PADS
6.0000 | MEDICATED_PAD | Freq: Every day | CUTANEOUS | Status: DC
  Administered 2021-03-02 – 2021-03-04 (×3): 6 via TOPICAL

## 2021-03-02 NOTE — TOC CAGE-AID Note (Signed)
Transition of Care [facility]) - CAGE-AID Screening   Patient Details  Name: Colleen Garrison MRN: [id number] Date of Birth: [birth year]  Transition of Care [facility]) CM/SW Contact:    Katha Hamming, RN Phone [phone] 03/02/2021, 9:42 PM   Clinical Narrative:  Patient reports no alcohol or drug use. Hx dementia/memory loss. No resources needed at this time.  CAGE-AID Screening:    Have You Ever Felt You Ought to Cut Down on Your Drinking or Drug Use?: No Have People Annoyed You By Critizing Your Drinking Or Drug Use?: No Have You Felt Bad Or Guilty About Your Drinking Or Drug Use?: No Have You Ever Had a Drink or Used Drugs First Thing In The Morning to Steady Your Nerves or to Get Rid of a Hangover?: No CAGE-AID Score: 0  Substance Abuse Education Offered: No

## 2021-03-02 NOTE — Progress Notes (Signed)
Called into room by Chancie, NT for patient's O2 sat being 85% on room air. Patient placed on 2L and encouraged to take deep breaths. After several deep breaths, patient was able to cough and O2 sats increased to 93%. Incentive spirometer given to patient and was completed 4 times achieving 1000 mL each time. This nurse encouraged patient to continue to use incentive spirometer and asked family to remind patient, verbalized understanding.

## 2021-03-02 NOTE — Progress Notes (Signed)
Subjective: 2 Days Post-Op Procedure(s) (LRB): INTRAMEDULLARY (IM) RETROGRADE FEMORAL NAILING (Right) Patient reports pain as mild.    Objective: Vital signs in last 24 hours: Temp:  [98.1 F (36.7 C)-98.3 F (36.8 C)] 98.2 F (36.8 C) (12/27 0745) Pulse Rate:  [68-76] 71 (12/27 0745) Resp:  [16-17] 16 (12/27 0745) BP: (96-117)/(42-51) 96/47 (12/27 0745) SpO2:  [90 %-96 %] 91 % (12/27 0745)  Intake/Output from previous day: 12/26 0701 - 12/27 0700 In: 1469.3 [P.O.:360; I.V.:1109.3] Out: 1130 [Urine:1130] Intake/Output this shift: Total I/O In: 360 [P.O.:360] Out: -   Recent Labs    02/28/21 1016 02/28/21 2359 03/02/21 0038  HGB 12.6 10.9* 10.5*   Recent Labs    02/28/21 2359 03/02/21 0038  WBC 17.1* 11.8*  RBC 3.52* 3.36*  HCT 33.8* 32.1*  PLT 224 232   Recent Labs    02/28/21 2359 03/02/21 0038  NA 134* 133*  K 3.7 3.7  CL 103 102  CO2 25 21*  BUN 26* 28*  CREATININE 1.28* 1.11*  GLUCOSE 131* 120*  CALCIUM 8.0* 7.9*   Recent Labs    02/28/21 1016  INR 1.2    Neurologically intact Neurovascular intact Sensation intact distally Intact pulses distally Dorsiflexion/Plantar flexion intact Incision: dressing C/D/I No cellulitis present Compartment soft   Assessment/Plan: 2 Days Post-Op Procedure(s) (LRB): INTRAMEDULLARY (IM) RETROGRADE FEMORAL NAILING (Right) Weightbearing: NWB RLE Insicional and dressing care: Dressings left intact until follow-up Orthopedic device(s): None Showering: with bandage  VTE prophylaxis: Lovenox 40mg  qd  2 weeks Pain control: percocet  Follow - up plan: 2 weeks Contact information:  MD, lindsey Glee Arvin PA       Dub Mikes 03/02/2021, 3:48 PM

## 2021-03-02 NOTE — Progress Notes (Signed)
This nurse asked patient if she wanted to try to get OOB to chair and patient agreed. Patient stated she would take pain medication after getting to chair. With this RN and family assistance, patient was not able to get up. PRN Oxycodone and Robaxin given as lunch tray arrived. This nurse informed patient that would again attempt after lunch, patient and family verbalized understanding. Bed alarm in place, bed in low position.

## 2021-03-02 NOTE — Progress Notes (Signed)
[facility] Progress Note  Patient: Colleen Garrison    IFO:[id number]  DOA: 02/28/2021    Date of Service: the patient was seen and examined on 03/02/2021  Brief hospital course: 87 year old female from independent living with past medical history of stage IIIa chronic kidney disease, restrictive lung disease with chronic respiratory failure with hypoxia on 2 L nasal cannula, senile dementia and hypothyroidism who presented to the emergency room on morning of 12/25 after she had a mechanical fall getting out of bed. (Patient had had a recent fall 2 days prior causing a small hematoma on the back of her head.)  Patient found to have a right spiral supracondylar femur fracture and taken by orthopedic surgery on night of 12/25 for retrograde IM nail.  On admission, patient also found to have mild rhabdomyolysis with a CK of 4700, improving with IV fluids.  Assessment and Plan: Respiratory Centrilobular emphysema and restrictive lung disease  Assessment & Plan Currently breathing comfortably on 2 L nasal cannula.  Denies any shortness of breath  Endocrine Hypothyroidism Assessment & Plan Continue Synthroid  Nervous and Auditory Late onset Alzheimer dementia [facility]) Assessment & Plan Forgetful and hopeful this would not affect her ability to participate in rehab.  Patient forgets that she already had surgery.  Musculoskeletal and Integument Rhabdomyolysis Assessment & Plan Secondary to fall and some mild period of immobility on ground before patient was discovered.  On IV fluids.  Likely cause of patient's mild AKI.  CK continues to trend downward.  * Closed fracture of distal end of right femur, unspecified fracture morphology, initial encounter [facility]) Assessment & Plan Status post IM nail.  Follow-up for acute blood loss anemia.  PT and OT recommended skilled nursing  Genitourinary Acute renal failure superimposed on stage 3a chronic kidney disease (HCC) Assessment &  Plan Treating with IV fluids.  Secondary to rhabdo.  Almost back to baseline.  Other Overweight (BMI 25.0-29.9) Assessment & Plan Patient meets criteria BMI greater than 25  Hypokalemia Assessment & Plan Replacing as needed  Depression Assessment & Plan Stable continue home medications  Leukocytosis Assessment & Plan Likely stress margination, maybe from rhabdomyolysis.  Down to 11.8 from 17.1 without abx intervention.  Prolonged QT interval Assessment & Plan Looks to be resolved.  Follow-up EKG on 12/26 notes a normal QT.  Monitor closely, try to avoid inducing medications including Zofran.    Body mass index is 26.63 kg/m.  Nutrition Problem: Increased nutrient needs Etiology: post-op healing, other (see comment) (Femur fracture)     Consultants: Orthopedic surgery  Procedures: Status post IM nail the night of 12/25  Antimicrobials: Preop Ancef  Code Status: DNR   Subjective: Occasional right leg soreness  Objective: Vital signs were reviewed and unremarkable. Vitals:   03/02/21 0550 03/02/21 0745  BP: (!) 117/51 (!) 96/47  Pulse: 76 71  Resp: 16 16  Temp: 98.3 F (36.8 C) 98.2 F (36.8 C)  SpO2: 94% 91%    Intake/Output Summary (Last 24 hours) at 03/02/2021 1341 Last data filed at 03/02/2021 0600 Gross per 24 hour  Intake 1469.33 ml  Output 1130 ml  Net 339.33 ml    Filed Weights   02/28/21 1012  Weight: 77.1 kg   Body mass index is 26.63 kg/m.  Exam:  General: Alert and oriented x2, no acute distress HEENT: Normocephalic and atraumatic, mucous membranes moist Cardiovascular: Regular rate and rhythm, S1-S2 Respiratory: Decreased breath sounds throughout Abdomen: Soft, nontender, nondistended, positive bowel sounds Musculoskeletal: No clubbing or cyanosis,  trace pitting edema Skin: No skin breaks, tears or lesions other than at site of surgery Psychiatry: Appropriate, no evidence of psychoses.  Underlying dementia Neurology: No  focal deficits  Data Reviewed: Creatinine down to 1.11 from 1.2 a few days ago.  CK down to 2600 from 4702 days ago.  Disposition:  Status is: Inpatient  Remains inpatient appropriate because: Needs skilled nursing set up after surgery  Family Communication: Updated daughter by phone. DVT Prophylaxis: enoxaparin (LOVENOX) injection 40 mg Start: 03/01/21 1200 Sequential compression device to OR Start: 02/28/21 2307 SCDs Start: 02/28/21 2307 Place TED hose Start: 02/28/21 2307 SCDs Start: 02/28/21 1340    Author: Annita Brod ,MD 03/02/2021 1:41 PM  To reach On-call, see care teams to locate the attending and reach out via [email or web address]. Between 7PM-7AM, please contact night-coverage If you still have difficulty reaching the attending provider, please page the [facility] (Director on Call) for [facility] on amion for assistance.

## 2021-03-02 NOTE — Progress Notes (Signed)
In and out done x 2 per MD's order.   Pt still due to void till noon today.  Will monitor pt.

## 2021-03-03 DIAGNOSIS — E663 Overweight: Secondary | ICD-10-CM

## 2021-03-03 DIAGNOSIS — D62 Acute posthemorrhagic anemia: Secondary | ICD-10-CM

## 2021-03-03 DIAGNOSIS — S72401A Unspecified fracture of lower end of right femur, initial encounter for closed fracture: Secondary | ICD-10-CM | POA: Diagnosis not present

## 2021-03-03 DIAGNOSIS — G301 Alzheimer's disease with late onset: Secondary | ICD-10-CM | POA: Diagnosis not present

## 2021-03-03 LAB — CBC
HCT: 28.9 % — ABNORMAL LOW (ref 36.0–46.0)
Hemoglobin: 9.5 g/dL — ABNORMAL LOW (ref 12.0–15.0)
MCH: 31.3 pg (ref 26.0–34.0)
MCHC: 32.9 g/dL (ref 30.0–36.0)
MCV: 95.1 fL (ref 80.0–100.0)
Platelets: 243 10*3/uL (ref 150–400)
RBC: 3.04 MIL/uL — ABNORMAL LOW (ref 3.87–5.11)
RDW: 12.8 % (ref 11.5–15.5)
WBC: 9.2 10*3/uL (ref 4.0–10.5)
nRBC: 0 % (ref 0.0–0.2)

## 2021-03-03 LAB — CK: Total CK: 830 U/L — ABNORMAL HIGH (ref 38–234)

## 2021-03-03 NOTE — Progress Notes (Signed)
[facility] Progress Note  Patient: Colleen Garrison    HBZ:[id number]  DOA: 02/28/2021    Date of Service: the patient was seen and examined on 03/03/2021  Brief hospital course: 87 year old female from independent living with past medical history of stage IIIa chronic kidney disease, restrictive lung disease with chronic respiratory failure with hypoxia on 2 L nasal cannula, senile dementia and hypothyroidism who presented to the emergency room on morning of 12/25 after she had a mechanical fall getting out of bed. (Patient had had a recent fall 2 days prior causing a small hematoma on the back of her head.)  Patient found to have a right spiral supracondylar femur fracture and taken by orthopedic surgery on night of 12/25 for retrograde IM nail.  Looking for skilled nursing.  On admission, patient also found to have mild rhabdomyolysis with a CK of 4700, improving with IV fluids and trending downward.  Assessment and Plan: Respiratory Centrilobular emphysema and restrictive lung disease  Assessment & Plan Currently breathing comfortably on 2 L nasal cannula.  Denies any shortness of breath  Endocrine Hypothyroidism Assessment & Plan Continue Synthroid  Nervous and Auditory Late onset Alzheimer dementia [facility]) Assessment & Plan Forgetful and hopeful this would not affect her ability to participate in rehab.  Musculoskeletal and Integument Rhabdomyolysis Assessment & Plan Secondary to fall and some mild period of immobility on ground before patient was discovered.  On IV fluids.  Likely cause of patient's mild AKI.  CK continues to trend downward.  * Closed fracture of distal end of right femur, unspecified fracture morphology, initial encounter [facility]) Assessment & Plan Status post IM nail.  PT and OT recommended skilled nursing.    Genitourinary Acute renal failure superimposed on stage 3a chronic kidney disease (HCC) Assessment & Plan Treating with IV fluids.  Secondary  to rhabdo.  Almost back to baseline.  Other Overweight (BMI 25.0-29.9) Assessment & Plan Patient meets criteria BMI greater than 25  Hypokalemia Assessment & Plan Replacing as needed  Depression Assessment & Plan Stable continue home medications  Leukocytosis Assessment & Plan Likely stress margination, maybe from rhabdomyolysis.  Resolved without abx intervention.  Prolonged QT interval Assessment & Plan Looks to be resolved.  Follow-up EKG on 12/26 notes a normal QT.  Monitor closely, try to avoid inducing medications including Zofran.  Acute blood loss anemia Assessment & Plan Hemoglobin dropped to 9.5 today.  Patient asymptomatic.  Not hypotensive.  We will continue to monitor.     Body mass index is 26.63 kg/m.  Nutrition Problem: Increased nutrient needs Etiology: post-op healing, other (see comment) (Femur fracture)     Consultants: Orthopedic surgery  Procedures: Status post IM nail the night of 12/25  Antimicrobials: Preop Ancef  Code Status: DNR   Subjective: Patient reported, complains of some mild leg soreness.    Objective: Vital signs were reviewed and unremarkable. Vitals:   03/03/21 0757 03/03/21 0814  BP: (!) 139/55   Pulse: 65   Resp: 17   Temp: 97.7 F (36.5 C)   SpO2: 94% 98%    Intake/Output Summary (Last 24 hours) at 03/03/2021 1405 Last data filed at 03/03/2021 1000 Gross per 24 hour  Intake 720 ml  Output 1275 ml  Net -555 ml    Filed Weights   02/28/21 1012  Weight: 77.1 kg   Body mass index is 26.63 kg/m.  Exam:  General: Alert and oriented x2, no acute distress HEENT: Normocephalic and atraumatic, mucous membranes moist Cardiovascular: Regular rate and  rhythm, S1-S2 Respiratory: Decreased breath sounds throughout Abdomen: Soft, nontender, nondistended, positive bowel sounds Musculoskeletal: No clubbing or cyanosis, trace pitting edema Skin: No skin breaks, tears or lesions other than at site of  surgery Psychiatry: Appropriate, no evidence of psychoses.  Underlying dementia Neurology: No focal deficits  Data Reviewed: Hemoglobin dropped to 9.5  Disposition:  Status is: Inpatient  Remains inpatient appropriate because: Needs skilled nursing set up after surgery, potentially as early as tomorrow if facility available  Family Communication: Daughter updated by phone. DVT Prophylaxis: enoxaparin (LOVENOX) injection 40 mg Start: 03/01/21 1200 Sequential compression device to OR Start: 02/28/21 2307 SCDs Start: 02/28/21 2307 Place TED hose Start: 02/28/21 2307 SCDs Start: 02/28/21 1340    Author: Annita Brod ,MD 03/03/2021 2:05 PM  To reach On-call, see care teams to locate the attending and reach out via [email or web address]. Between 7PM-7AM, please contact night-coverage If you still have difficulty reaching the attending provider, please page the [facility] (Director on Call) for [facility] on amion for assistance.

## 2021-03-03 NOTE — Care Management Important Message (Signed)
Important Message  Patient Details  Name: Colleen Garrison MRN: [id number] Date of Birth: 1931-06-09   Medicare Important Message Given:  Yes     Sherilyn Banker 03/03/2021, 12:41 PM

## 2021-03-03 NOTE — TOC Initial Note (Signed)
Transition of Care [facility]) - Initial/Assessment Note    Patient Details  Name: Colleen Garrison MRN: [id number] Date of Birth: May 26, 1931  [facility]) CM/SW Contact:    Emeterio Reeve, LCSW Phone Number: 03/03/2021, 4:13 PM  Clinical Narrative:                  CSW received SNF consult. CSW met with pt at bedside. CSW introduced self and explained role at the hospital. Pt reports that PTA she lived at home with spouse.  CSW reviewed PT/OT recommendations for SNF. Pt reports SNF is fine. Pt gave CSW permission to fax out to facilities in the area. Pt prefers [facility]. CSW gave pt medicare.gov rating list to review. CSW explained insurance auth process.   [facility] is able to accept pt at [place]. Insurance Josem Kaufmann has been initiated.   CSW will continue to follow.  Expected Discharge Plan: Skilled Nursing Facility Barriers to Discharge: Continued Medical Work up   Patient Goals and CMS Choice Patient states their goals for this hospitalization and ongoing recovery are:: snf CMS Medicare.gov Compare Post Acute Care list provided to:: Patient Choice offered to / list presented to : Adult Children, Patient  Expected Discharge Plan and Services Expected Discharge Plan: [place]       Living arrangements for the past 2 months: Single Family Home                                      Prior Living Arrangements/Services Living arrangements for the past 2 months: Single Family Home Lives with:: Spouse Patient language and need for interpreter reviewed:: Yes Do you feel safe going back to the place where you live?: Yes      Need for Family Participation in Patient Care: Yes (Comment) Care giver support system in place?: Yes (comment)   Criminal Activity/Legal Involvement Pertinent to Current Situation/Hospitalization: No - Comment as needed  Activities of Daily Living Home Assistive Devices/Equipment: Eyeglasses ADL Screening (condition at  time of admission) Patient's cognitive ability adequate to safely complete daily activities?: Yes Is the patient deaf or have difficulty hearing?: No Does the patient have difficulty seeing, even when wearing glasses/contacts?: No Does the patient have difficulty concentrating, remembering, or making decisions?: No Patient able to express need for assistance with ADLs?: Yes Does the patient have difficulty dressing or bathing?: Yes Independently performs ADLs?: No Communication: Independent Dressing (OT): Needs assistance Is this a change from baseline?: Change from baseline, expected to last >3 days Grooming: Needs assistance Is this a change from baseline?: Change from baseline, expected to last <3 days Feeding: Independent Bathing: Needs assistance Is this a change from baseline?: Change from baseline, expected to last >3 days Toileting: Needs assistance Is this a change from baseline?: Change from baseline, expected to last >3days In/Out Bed: Needs assistance Is this a change from baseline?: Change from baseline, expected to last >3 days Walks in Home: Needs assistance Is this a change from baseline?: Change from baseline, expected to last >3 days Does the patient have difficulty walking or climbing stairs?: Yes Weakness of Legs: Both Weakness of Arms/Hands: None  Permission Sought/Granted Permission sought to share information with : Facility Sport and exercise psychologist, Family Supports Permission granted to share information with : Yes, Verbal Permission Granted     Permission granted to share info w AGENCY: SNF        Emotional  Assessment Appearance:: Appears stated age Attitude/Demeanor/Rapport: Engaged Affect (typically observed): Appropriate Orientation: : Oriented to Self, Oriented to Place, Oriented to  Time, Oriented to Situation Alcohol / Substance Use: Not Applicable Psych Involvement: No (comment)  Admission diagnosis:  Pain [R52] Fall [W19.XXXA] Fall, initial  encounter [id number]] Closed fracture of distal end of right femur, unspecified fracture morphology, initial encounter ([place]) [S72.401A] Patient Active Problem List   Diagnosis Date Noted   Acute blood loss anemia 03/03/2021   Overweight (BMI 25.0-29.9) 03/01/2021   Rhabdomyolysis 03/01/2021   Acute renal failure superimposed on stage 3a chronic kidney disease ([place]) 02/28/2021   Leukocytosis 02/28/2021   Closed fracture of distal end of right femur, unspecified fracture morphology, initial encounter ([place]) 02/28/2021   Hypokalemia 02/28/2021   Prolonged QT interval 02/28/2021   Centrilobular emphysema and restrictive lung disease  04/03/2019   Chronic respiratory failure with hypoxia ([place]) 04/03/2019   Nausea 03/08/2019   Dementia ([place]) 03/08/2019   Late onset Alzheimer dementia ([place]) 03/08/2019   Generalized weakness 03/07/2019   Hypothyroidism 02/28/2019   Depression    Insomnia 12/02/2013   PCP:  Loraine Leriche., MD Pharmacy:   [id number] [place], [place] - [address] [facility] [id number]: [phone]: [phone]    Social Determinants of Health (SDOH) Interventions    Readmission Risk Interventions No flowsheet data found.  MEmeterio Reeve LCSW Clinical Social Worker

## 2021-03-03 NOTE — Evaluation (Signed)
Physical Therapy Evaluation Patient Details Name: Colleen Garrison MRN: [id number] DOB: 1931/06/09 Today's Date: 03/03/2021  History of Present Illness  Pt is an 87 y/o female admitted after falling out of bed in which she sustained a R distal femur fx. She is s/p IM nail, NWB R LE. PMH including but not limited mild dementia.  Clinical Impression  Pt progressing towards goals. Required mod A for bed mobility and transfers this session. Able to perform HEP as well. Current recommendations for SNF appropriate. Will continue to follow acutely.        Recommendations for follow up therapy are one component of a multi-disciplinary discharge planning process, led by the attending physician.  Recommendations may be updated based on patient status, additional functional criteria and insurance authorization.  Follow Up Recommendations Skilled nursing-short term rehab (<3 hours/day)    Assistance Recommended at Discharge Frequent or constant Supervision/Assistance  Functional Status Assessment    Equipment Recommendations  Other (comment) (TBD)    Recommendations for Other Services       Precautions / Restrictions Precautions Precautions: Fall Restrictions Weight Bearing Restrictions: Yes RLE Weight Bearing: Non weight bearing      Mobility  Bed Mobility Overal bed mobility: Needs Assistance Bed Mobility: Supine to Sit;Sit to Supine     Supine to sit: Mod assist Sit to supine: Mod assist   General bed mobility comments: Mod A for trunk elevation and LE assist. Increased time required.    Transfers Overall transfer level: Needs assistance Equipment used: Rolling walker (2 wheels) Transfers: Sit to/from Stand Sit to Stand: Mod assist;From elevated surface           General transfer comment: Required increased time and effort. PT foot under pt's RLE to assist with maintaining NWB. Able to stand X 2 this session.    Ambulation/Gait                  Stairs             Wheelchair Mobility    Modified Rankin (Stroke Patients Only)       Balance Overall balance assessment: Needs assistance;History of Falls Sitting-balance support: Feet supported Sitting balance-Leahy Scale: Fair     Standing balance support: Bilateral upper extremity supported Standing balance-Leahy Scale: Poor Standing balance comment: Reliant on BUE support                             Pertinent Vitals/Pain Pain Assessment: Faces Faces Pain Scale: Hurts even more Pain Location: R LE Pain Descriptors / Indicators: Grimacing;Guarding;Sore Pain Intervention(s): Monitored during session;Limited activity within patient's tolerance;Repositioned    Home Living                          Prior Function                       Hand Dominance        Extremity/Trunk Assessment                Communication      Cognition Arousal/Alertness: Awake/alert Behavior During Therapy: WFL for tasks assessed/performed Overall Cognitive Status: History of cognitive impairments - at baseline  General Comments      Exercises General Exercises - Lower Extremity Ankle Circles/Pumps: AROM;Both;20 reps Quad Sets: AROM;Both;10 reps Heel Slides: AAROM;Right;AROM;Left;10 reps   Assessment/Plan    PT Assessment    PT Problem List         PT Treatment Interventions      PT Goals (Current goals can be found in the Care Plan section)  Acute Rehab PT Goals Patient Stated Goal: decrease pain PT Goal Formulation: With patient/family Time For Goal Achievement: 03/15/21 Potential to Achieve Goals: Good    Frequency Min 3X/week   Barriers to discharge        Co-evaluation               AM-PAC PT "6 Clicks" Mobility  Outcome Measure Help needed turning from your back to your side while in a flat bed without using bedrails?: A Lot Help needed moving from lying on your  back to sitting on the side of a flat bed without using bedrails?: A Lot Help needed moving to and from a bed to a chair (including a wheelchair)?: A Lot Help needed standing up from a chair using your arms (e.g., wheelchair or bedside chair)?: A Lot Help needed to walk in hospital room?: Total Help needed climbing 3-5 steps with a railing? : Total 6 Click Score: 10    End of Session Equipment Utilized During Treatment: Gait belt Activity Tolerance: Patient tolerated treatment well Patient left: in bed;with call bell/phone within reach;with bed alarm set;with family/visitor present Nurse Communication: Mobility status PT Visit Diagnosis: Other abnormalities of gait and mobility (R26.89);Pain Pain - Right/Left: Right Pain - part of body: Leg    Time: 2947-6546 PT Time Calculation (min) (ACUTE ONLY): 29 min   Charges:     PT Treatments $Therapeutic Exercise: 8-22 mins $Therapeutic Activity: 8-22 mins        Cindee Salt, DPT  Acute Rehabilitation Services  Pager: [phone] Office: [phone]   Lehman Prom 03/03/2021, 4:11 PM

## 2021-03-03 NOTE — Assessment & Plan Note (Signed)
Hemoglobin dropped to 9.5 today.  Patient asymptomatic.  Not hypotensive.  We will continue to monitor.

## 2021-03-03 NOTE — NC FL2 (Signed)
[place] MEDICAID FL2 LEVEL OF CARE SCREENING TOOL     IDENTIFICATION  Patient Name: Colleen Garrison Birthdate: 1931-06-28 Sex: female Admission Date (Current Location): 02/28/2021  [facility] and [place] Number:  Producer, television/film/video and Address:  [facility], [address], [place], [place] [address]      Provider Number: [id number]  Attending Physician Name and Address:  Hollice Espy, MD  Relative Name and Phone Number:       Current Level of Care: Hospital Recommended Level of Care: Skilled Nursing Facility Prior Approval Number:    Date Approved/Denied:   PASRR Number: [id number]  Discharge Plan: SNF    Current Diagnoses: Patient Active Problem List   Diagnosis Date Noted   Overweight (BMI 25.0-29.9) 03/01/2021   Rhabdomyolysis 03/01/2021   Acute renal failure superimposed on stage 3a chronic kidney disease (HCC) 02/28/2021   Leukocytosis 02/28/2021   Closed fracture of distal end of right femur, unspecified fracture morphology, initial encounter (HCC) 02/28/2021   Hypokalemia 02/28/2021   Prolonged QT interval 02/28/2021   Centrilobular emphysema and restrictive lung disease  04/03/2019   Chronic respiratory failure with hypoxia (HCC) 04/03/2019   Nausea 03/08/2019   Dementia (HCC) 03/08/2019   Late onset Alzheimer dementia (HCC) 03/08/2019   Generalized weakness 03/07/2019   Hypothyroidism 02/28/2019   Depression    Insomnia 12/02/2013    Orientation RESPIRATION BLADDER Height & Weight     Self, Time, Situation, Place  Normal Incontinent Weight: 170 lb (77.1 kg) Height:  5\' 7"  (170.2 cm)  BEHAVIORAL SYMPTOMS/MOOD NEUROLOGICAL BOWEL NUTRITION STATUS      Incontinent Diet  AMBULATORY STATUS COMMUNICATION OF NEEDS Skin   Extensive Assist Verbally Normal, Surgical wounds                       Personal Care Assistance Level of Assistance  Bathing, Feeding, Dressing Bathing Assistance: Maximum assistance Feeding  assistance: Limited assistance Dressing Assistance: Maximum assistance     Functional Limitations Info  Sight, Hearing, Speech Sight Info: Adequate Hearing Info: Adequate Speech Info: Adequate    SPECIAL CARE FACTORS FREQUENCY  PT (By licensed PT), OT (By licensed OT)     PT Frequency: 5x a week OT Frequency: 5x a week            Contractures Contractures Info: Not present    Additional Factors Info  Code Status, Allergies Code Status Info: DNR Allergies Info: Codeine           Current Medications (03/03/2021):  This is the current hospital active medication list Current Facility-Administered Medications  Medication Dose Route Frequency Provider Last Rate Last Admin   0.9 %  sodium chloride infusion   Intravenous Continuous 03/05/2021, MD 75 mL/hr at 03/01/21 1318 New Bag at 03/01/21 1318   acetaminophen (TYLENOL) tablet 325-650 mg  325-650 mg Oral Q6H PRN 03/03/21, MD   650 mg at 03/03/21 0952   albuterol (PROVENTIL) (2.5 MG/3ML) 0.083% nebulizer solution 2.5 mg  2.5 mg Inhalation Q4H PRN 03/05/21, MD       alum & mag hydroxide-simeth (MAALOX/MYLANTA) 200-200-20 MG/5ML suspension 30 mL  30 mL Oral Q4H PRN 09-13-2000, MD       Chlorhexidine Gluconate Cloth 2 % PADS 6 each  6 each Topical Daily Tarry Kos, MD   6 each at 03/03/21 0953   docusate sodium (COLACE) capsule 100 mg  100 mg Oral BID 03/05/21,  Marylynn Pearson, MD   100 mg at 03/03/21 0953   donepezil (ARICEPT) tablet 10 mg  10 mg Oral Daily Orma Flaming, MD   10 mg at 03/03/21 0952   enoxaparin (LOVENOX) injection 40 mg  40 mg Subcutaneous Q24H Leandrew Koyanagi, MD   40 mg at 03/02/21 1149   escitalopram (LEXAPRO) tablet 20 mg  20 mg Oral Daily Orma Flaming, MD   20 mg at 03/03/21 [id number]   feeding supplement (ENSURE ENLIVE / ENSURE PLUS) liquid 237 mL  237 mL Oral BID BM Annita Brod, MD   237 mL at 03/03/21 0953   fluticasone furoate-vilanterol (BREO ELLIPTA) 100-25 MCG/ACT 1 puff  1 puff  Inhalation Daily Leandrew Koyanagi, MD   1 puff at 03/03/21 0814   HYDROmorphone (DILAUDID) injection 0.5-1 mg  0.5-1 mg Intravenous Q4H PRN Leandrew Koyanagi, MD       levothyroxine (SYNTHROID) tablet 75 mcg  75 mcg Oral Q0600 Orma Flaming, MD   75 mcg at 03/03/21 0508   memantine (NAMENDA) tablet 10 mg  10 mg Oral BID Orma Flaming, MD   10 mg at 03/03/21 [id number]   menthol-cetylpyridinium (CEPACOL) lozenge 3 mg  1 lozenge Oral PRN Leandrew Koyanagi, MD       Or   phenol (CHLORASEPTIC) mouth spray 1 spray  1 spray Mouth/Throat PRN Leandrew Koyanagi, MD       methocarbamol (ROBAXIN) tablet 500 mg  500 mg Oral Q6H PRN Leandrew Koyanagi, MD   500 mg at 03/03/21 [id number]   Or   methocarbamol (ROBAXIN) 500 mg in dextrose 5 % 50 mL IVPB  500 mg Intravenous Q6H PRN Leandrew Koyanagi, MD       metoCLOPramide (REGLAN) tablet 5-10 mg  5-10 mg Oral Q8H PRN Leandrew Koyanagi, MD       Or   metoCLOPramide (REGLAN) injection 5-10 mg  5-10 mg Intravenous Q8H PRN Leandrew Koyanagi, MD       morphine 2 MG/ML injection 1 mg  1 mg Intravenous Q2H PRN Leandrew Koyanagi, MD   1 mg at 02/28/21 2317   multivitamin with minerals tablet 1 tablet  1 tablet Oral Daily Annita Brod, MD   1 tablet at 03/03/21 [id number]   oxyCODONE (Oxy IR/ROXICODONE) immediate release tablet 10-15 mg  10-15 mg Oral Q4H PRN Leandrew Koyanagi, MD       oxyCODONE (Oxy IR/ROXICODONE) immediate release tablet 5-10 mg  5-10 mg Oral Q4H PRN Leandrew Koyanagi, MD   5 mg at 03/02/21 2015   polyethylene glycol (MIRALAX / GLYCOLAX) packet 17 g  17 g Oral Daily PRN Leandrew Koyanagi, MD   17 g at 03/01/21 1118   povidone-iodine 10 % swab 2 application  2 application Topical Once Leandrew Koyanagi, MD       sorbitol 70 % solution 30 mL  30 mL Oral Daily PRN Leandrew Koyanagi, MD       traZODone (DESYREL) tablet 50 mg  50 mg Oral QHS Orma Flaming, MD   50 mg at 03/02/21 2015     Discharge Medications: Please see discharge summary for a list of discharge medications.  Relevant Imaging  Results:  Relevant Lab Results:   Additional Information SSN: [id number]  Emeterio Reeve, LCSW

## 2021-03-04 DIAGNOSIS — G301 Alzheimer's disease with late onset: Secondary | ICD-10-CM | POA: Diagnosis not present

## 2021-03-04 DIAGNOSIS — D62 Acute posthemorrhagic anemia: Secondary | ICD-10-CM | POA: Diagnosis not present

## 2021-03-04 DIAGNOSIS — S72401A Unspecified fracture of lower end of right femur, initial encounter for closed fracture: Secondary | ICD-10-CM | POA: Diagnosis not present

## 2021-03-04 DIAGNOSIS — J9611 Chronic respiratory failure with hypoxia: Secondary | ICD-10-CM | POA: Diagnosis not present

## 2021-03-04 LAB — CBC
HCT: 28.8 % — ABNORMAL LOW (ref 36.0–46.0)
Hemoglobin: 9.7 g/dL — ABNORMAL LOW (ref 12.0–15.0)
MCH: 32 pg (ref 26.0–34.0)
MCHC: 33.7 g/dL (ref 30.0–36.0)
MCV: 95 fL (ref 80.0–100.0)
Platelets: 294 10*3/uL (ref 150–400)
RBC: 3.03 MIL/uL — ABNORMAL LOW (ref 3.87–5.11)
RDW: 12.8 % (ref 11.5–15.5)
WBC: 10.3 10*3/uL (ref 4.0–10.5)
nRBC: 0 % (ref 0.0–0.2)

## 2021-03-04 LAB — BASIC METABOLIC PANEL
Anion gap: 8 (ref 5–15)
BUN: 18 mg/dL (ref 8–23)
CO2: 25 mmol/L (ref 22–32)
Calcium: 7.8 mg/dL — ABNORMAL LOW (ref 8.9–10.3)
Chloride: 105 mmol/L (ref 98–111)
Creatinine, Ser: 0.89 mg/dL (ref 0.44–1.00)
GFR, Estimated: >60 mL/min (ref >60)
Glucose, Bld: 110 mg/dL — ABNORMAL HIGH (ref 70–99)
Potassium: 3.4 mmol/L — ABNORMAL LOW (ref 3.5–5.1)
Sodium: 138 mmol/L (ref 135–145)

## 2021-03-04 LAB — RESP PANEL BY RT-PCR (FLU A&B, COVID) ARPGX2
Influenza A by PCR: NEGATIVE
Influenza B by PCR: NEGATIVE
SARS Coronavirus 2 by RT PCR: NEGATIVE

## 2021-03-04 MED ORDER — POLYETHYLENE GLYCOL 3350 17 G PO PACK: 17.0000 g | PACK | Freq: Every day | ORAL | 0 refills | Status: AC | PRN

## 2021-03-04 MED ORDER — ENSURE ENLIVE PO LIQD: 237.0000 mL | Freq: Two times a day (BID) | ORAL | 12 refills | Status: AC

## 2021-03-04 NOTE — Discharge Summary (Signed)
Physician Discharge Summary   Patient: Colleen Garrison MRN: [id number] DOB: @DOB   Admit date:     02/28/2021  Discharge date: 03/04/21  Discharge Physician: Annita Brod   PCP: Loraine Leriche., MD   Recommendations at discharge: 1.  New medication: Percocet 5/325 p.o. every 8 hours as needed for pain 2. patient being discharged to skilled nursing facility 3. patient will follow-up with Dr.Xu in 2 weeks for wound recheck and suture removal 4.  Lovenox 40 mg subcu daily x2 weeks for DVT prophylaxis.   Hospital Course   87 year old female from independent living with past medical history of stage IIIa chronic kidney disease, restrictive lung disease with chronic respiratory failure with hypoxia on 2 L nasal cannula, senile dementia and hypothyroidism who presented to the emergency room on morning of 12/25 after she had a mechanical fall getting out of bed. (Patient had had a recent fall 2 days prior causing a small hematoma on the back of her head.)  Patient found to have a right spiral supracondylar femur fracture and taken by orthopedic surgery on night of 12/25 for retrograde IM nail.  By 12/29, patient stable.  Accepted for skilled nursing.  Follow-up with orthopedic surgery in 2 weeks.  Also on admission, patient also found to have mild rhabdomyolysis with a CK of 4700, improving with IV fluids and trending downward.  Discharge Diagnoses Respiratory Centrilobular emphysema and restrictive lung disease  Assessment & Plan Currently breathing comfortably on 2 L nasal cannula.  Denies any shortness of breath  Endocrine Hypothyroidism Assessment & Plan Continue Synthroid  Nervous and Auditory Late onset Alzheimer dementia ([place]) Assessment & Plan Stable.  Continue Namenda.  Musculoskeletal and Integument Rhabdomyolysis Assessment & Plan Secondary to fall and some mild period of immobility on ground before patient was discovered.  On IV fluids.  Likely cause of  patient's mild AKI.  CK down to 830 x 12/28  * Closed fracture of distal end of right femur, unspecified fracture morphology, initial encounter [facility]) Assessment & Plan Status post IM nail.  Seen by PT and OT who recommended skilled nursing.  Dressings to stay on until patient is seen in follow-up in 2 weeks.  Lovenox 40 mg daily for 2 weeks for DVT prophylaxis.  As needed Percocet for pain.  Genitourinary Acute renal failure superimposed on stage 3a chronic kidney disease ([place]) Assessment & Plan Treating with IV fluids.  Secondary to rhabdo.  By day of discharge, creatinine down to 0.89  Other Overweight (BMI 25.0-29.9) Assessment & Plan Patient meets criteria BMI greater than 25  Hypokalemia Assessment & Plan Replacing as needed  Depression Assessment & Plan Stable continue home medications  Leukocytosis Assessment & Plan Likely stress margination, maybe from rhabdomyolysis.  Resolved without abx intervention.  Prolonged QT interval Assessment & Plan Looks to be resolved.  Follow-up EKG on 12/26 notes a normal QT.    Acute blood loss anemia Assessment & Plan Hemoglobin dropped slightly down to 9.5, but by day of discharge, up to 9.7.  Patient asymptomatic.  Not hypotensive.    Consultants: Erlinda Hong, orthopedic surgery Procedures performed: IM nail of right hip, done 12/25 Disposition: Skilled nursing facility Diet recommendation: Regular diet  DISCHARGE MEDICATION: Allergies as of 03/04/2021       Reactions   Codeine Hives, Nausea And Vomiting        Medication List     TAKE these medications    acetaminophen 500 MG tablet Commonly known as: TYLENOL Take 500 mg by mouth  every 6 (six) hours as needed for mild pain, fever or headache.   ascorbic acid 500 MG tablet Commonly known as: VITAMIN C Take 1 tablet (500 mg total) by mouth daily.   b complex vitamins capsule Take 1 capsule by mouth daily.   Breo Ellipta 100-25 MCG/ACT Aepb Generic drug: fluticasone  furoate-vilanterol Inhale 1 puff into the lungs daily.   Cholecalciferol 25 MCG (1000 UT) tablet Take 1,000 Units by mouth daily.   donepezil 10 MG tablet Commonly known as: ARICEPT Take 10 mg by mouth daily.   enoxaparin 40 MG/0.4ML injection Commonly known as: LOVENOX Inject 0.4 mLs (40 mg total) into the skin daily for 14 days.   escitalopram 20 MG tablet Commonly known as: LEXAPRO Take 20 mg by mouth daily.   feeding supplement Liqd Take 237 mLs by mouth 2 (two) times daily between meals.   levothyroxine 75 MCG tablet Commonly known as: SYNTHROID Take 75 mcg by mouth daily.   memantine 10 MG tablet Commonly known as: NAMENDA Take 10 mg by mouth 2 (two) times daily.   OMEGA-3 FISH OIL PO Take 1 capsule by mouth daily.   oxyCODONE-acetaminophen 5-325 MG tablet Commonly known as: Percocet Take 1-2 tablets by mouth every 8 (eight) hours as needed for severe pain.   polyethylene glycol 17 g packet Commonly known as: MIRALAX / GLYCOLAX Take 17 g by mouth daily as needed for mild constipation.   traZODone 50 MG tablet Commonly known as: DESYREL Take 50 mg by mouth at bedtime.   VITAMIN K PO Take 1 tablet by mouth daily.               Discharge Care Instructions  (From admission, onward)           Start     Ordered   03/04/21 0000  Discharge wound care:       Comments: Reinforce dressing as needed   03/04/21 1156   02/28/21 0000  Non weight bearing        02/28/21 1932            Follow-up Information     Leandrew Koyanagi, MD Follow up in 2 week(s).   Specialty: Orthopedic Surgery Why: For suture removal, For wound re-check Contact information: [address] [place] [address] [phone]                 Discharge Exam: Danley Danker Weights   02/28/21 1012  Weight: 77.1 kg   General: Alert and oriented x2, no acute distress Cardiovascular: Regular rate and rhythm, S1-S2 Lungs: Clear to auscultation  bilaterally  Condition at discharge: good  The results of significant diagnostics from this hospitalization (including imaging, microbiology, ancillary and laboratory) are listed below for reference.   Imaging Studies: DG Knee 1-2 Views Right  Result Date: 02/28/2021 CLINICAL DATA:  Post fall, now with right knee pain. EXAM: RIGHT KNEE - 1-2 VIEW COMPARISON:  Right femur radiographs-earlier same day FINDINGS: There is an acute obliquely oriented, displaced and foreshortened fracture of the distal femoral diaphysis. No intra-articular extension. Expected adjacent soft tissue swelling. Moderate to severe degenerative change of the knee, worse within the lateral compartment with joint space loss, subchondral sclerosis and osteophytosis. There is spurring of the tibial spines. No definite knee joint effusion. Scattered adjacent vascular calcifications. No radiopaque foreign body. IMPRESSION: 1. Acute obliquely oriented, displaced and foreshortened fracture of the distal femoral diaphysis without definitive intra-articular extension. 2. Moderate to severe degenerative change of the knee, worse  within the lateral compartment. Electronically Signed   By: Sandi Mariscal M.D.   On: 02/28/2021 10:58   CT Head Wo Contrast  Result Date: 02/26/2021 CLINICAL DATA:  Fall, head injury EXAM: CT HEAD WITHOUT CONTRAST CT CERVICAL SPINE WITHOUT CONTRAST TECHNIQUE: Multidetector CT imaging of the head and cervical spine was performed following the standard protocol without intravenous contrast. Multiplanar CT image reconstructions of the cervical spine were also generated. COMPARISON:  CT head 03/07/2019 FINDINGS: CT HEAD FINDINGS Brain: Mild atrophy. Patchy white matter hypodensity bilaterally unchanged compatible with chronic microvascular ischemia. Negative for acute infarct, hemorrhage, mass. Vascular: Negative for hyperdense vessel Skull: Negative Sinuses/Orbits: Paranasal sinuses clear. Bilateral cataract surgery.  Other: Posterior convexity scalp laceration and scalp hematoma in the midline. Gas in the subcutaneous tissues. CT CERVICAL SPINE FINDINGS Alignment: Anterolisthesis C7-T1.  Otherwise normal alignment Skull base and vertebrae: Negative for fracture Soft tissues and spinal canal: No soft tissue mass or edema. Disc levels: Disc degeneration and spurring most prominent C4-5 and C5-6 causing foraminal narrowing bilaterally. Upper chest: Apical scarring bilaterally with emphysema. Other: None IMPRESSION: 1. No acute intracranial abnormality 2. Posterior scalp laceration and hematoma 3. Negative for cervical spine fracture. Electronically Signed   By: Franchot Gallo M.D.   On: 02/26/2021 14:14   CT Cervical Spine Wo Contrast  Result Date: 02/26/2021 CLINICAL DATA:  Fall, head injury EXAM: CT HEAD WITHOUT CONTRAST CT CERVICAL SPINE WITHOUT CONTRAST TECHNIQUE: Multidetector CT imaging of the head and cervical spine was performed following the standard protocol without intravenous contrast. Multiplanar CT image reconstructions of the cervical spine were also generated. COMPARISON:  CT head 03/07/2019 FINDINGS: CT HEAD FINDINGS Brain: Mild atrophy. Patchy white matter hypodensity bilaterally unchanged compatible with chronic microvascular ischemia. Negative for acute infarct, hemorrhage, mass. Vascular: Negative for hyperdense vessel Skull: Negative Sinuses/Orbits: Paranasal sinuses clear. Bilateral cataract surgery. Other: Posterior convexity scalp laceration and scalp hematoma in the midline. Gas in the subcutaneous tissues. CT CERVICAL SPINE FINDINGS Alignment: Anterolisthesis C7-T1.  Otherwise normal alignment Skull base and vertebrae: Negative for fracture Soft tissues and spinal canal: No soft tissue mass or edema. Disc levels: Disc degeneration and spurring most prominent C4-5 and C5-6 causing foraminal narrowing bilaterally. Upper chest: Apical scarring bilaterally with emphysema. Other: None IMPRESSION: 1. No  acute intracranial abnormality 2. Posterior scalp laceration and hematoma 3. Negative for cervical spine fracture. Electronically Signed   By: Franchot Gallo M.D.   On: 02/26/2021 14:14   DG CHEST PORT 1 VIEW  Result Date: 02/28/2021 CLINICAL DATA:  Preop evaluation, fracture right femur EXAM: PORTABLE CHEST 1 VIEW COMPARISON:  03/07/2019 FINDINGS: Transverse diameter of heart is increased. There is soft tissue fullness in the retrocardiac region suggesting fixed hiatal hernia. There are no signs of alveolar pulmonary edema or focal pulmonary consolidation. There is subtle increased density in the lateral aspect of left lower lung fields. There is no significant pleural effusion or pneumothorax. IMPRESSION: Cardiomegaly. There are no signs of pulmonary edema or focal pulmonary consolidation. Subtle increased density in the lateral aspect of left lower lung fields may suggest scarring or subsegmental atelectasis. Large fixed hiatal hernia. Electronically Signed   By: Elmer Picker M.D.   On: 02/28/2021 13:34   DG C-Arm 1-60 Min-No Report  Result Date: 02/28/2021 Fluoroscopy was utilized by the requesting physician.  No radiographic interpretation.   DG C-Arm 1-60 Min-No Report  Result Date: 02/28/2021 Fluoroscopy was utilized by the requesting physician.  No radiographic interpretation.   DG C-Arm 1-60  Min-No Report  Result Date: 02/28/2021 Fluoroscopy was utilized by the requesting physician.  No radiographic interpretation.   DG FEMUR, MIN 2 VIEWS RIGHT  Result Date: 03/01/2021 CLINICAL DATA:  Right femoral fracture ORIF EXAM: RIGHT FEMUR 2 VIEWS COMPARISON:  02/28/2021 FINDINGS: Interval postoperative changes of ORIF of distal right femoral fracture with retrograde long intramedullary nail and interlocking screw fixation. Improved fracture alignment, now near anatomic. No new fracture. Alignment at the hip and knee are maintained without dislocation. Advanced degenerative changes of  the right knee. Diffuse bony demineralization. Expected postoperative changes within the soft tissues at the distal femur. Scattered vascular calcifications. IMPRESSION: Interval postoperative changes of ORIF of distal right femoral fracture, now in near anatomic alignment. Electronically Signed   By: Davina Poke D.O.   On: 03/01/2021 09:24   DG FEMUR, MIN 2 VIEWS RIGHT  Result Date: 02/28/2021 CLINICAL DATA:  Femur fracture EXAM: RIGHT FEMUR 2 VIEWS COMPARISON:  02/28/2021 FINDINGS: Six low resolution intraoperative spot views of the right femur. Total fluoroscopy time was 2 minutes 50 seconds. The images demonstrate intramedullary rod and screw fixation of distal femoral fracture with near anatomic alignment. IMPRESSION: Intraoperative fluoroscopic assistance provided during surgical fixation of right femoral fracture Electronically Signed   By: Donavan Foil M.D.   On: 02/28/2021 22:07   DG Femur Min 2 Views Right  Result Date: 02/28/2021 CLINICAL DATA:  Post fall, now with right leg pain. EXAM: RIGHT FEMUR 2 VIEWS COMPARISON:  Right knee radiographs-earlier same day FINDINGS: There is an acute obliquely oriented, displaced and foreshortened fracture of the distal femoral diaphysis. No intra-articular extension. Expected adjacent soft tissue swelling. Moderate to severe degenerative change of the knee, worse within the lateral compartment with joint space loss, subchondral sclerosis and osteophytosis. There is spurring of the tibial spines. No definite knee joint effusion. Scattered adjacent vascular calcifications. No radiopaque foreign body. IMPRESSION: 1. Acute obliquely oriented, displaced and foreshortened fracture of the distal femoral diaphysis without definitive intra-articular extension. 2. Moderate to severe degenerative change of the knee, worse within the lateral compartment. Electronically Signed   By: Sandi Mariscal M.D.   On: 02/28/2021 10:57    Microbiology: Results for orders  placed or performed during the hospital encounter of 02/28/21  Resp Panel by RT-PCR (Flu A&B, Covid) Nasopharyngeal Swab     Status: None   Collection Time: 02/28/21  2:07 PM   Specimen: Nasopharyngeal Swab; Nasopharyngeal(NP) swabs in vial transport medium  Result Value Ref Range Status   SARS Coronavirus 2 by RT PCR NEGATIVE NEGATIVE Final    Comment: (NOTE) SARS-CoV-2 target nucleic acids are NOT DETECTED.  The SARS-CoV-2 RNA is generally detectable in upper respiratory specimens during the acute phase of infection. The lowest concentration of SARS-CoV-2 viral copies this assay can detect is 138 copies/mL. A negative result does not preclude SARS-Cov-2 infection and should not be used as the sole basis for treatment or other patient management decisions. A negative result may occur with  improper specimen collection/handling, submission of specimen other than nasopharyngeal swab, presence of viral mutation(s) within the areas targeted by this assay, and inadequate number of viral copies(<138 copies/mL). A negative result must be combined with clinical observations, patient history, and epidemiological information. The expected result is Negative.  Fact Sheet for Patients:  EntrepreneurPulse.com.au  Fact Sheet for Healthcare Providers:  IncredibleEmployment.be  This test is no t yet approved or cleared by the [place] FDA and  has been authorized for detection and/or diagnosis of SARS-CoV-2  by FDA under an Emergency Use Authorization (EUA). This EUA will remain  in effect (meaning this test can be used) for the duration of the COVID-19 declaration under Section 564(b)(1) of the Act, 21 U.S.C.section 360bbb-3(b)(1), unless the authorization is terminated  or revoked sooner.       Influenza A by PCR NEGATIVE NEGATIVE Final   Influenza B by PCR NEGATIVE NEGATIVE Final    Comment: (NOTE) The Xpert Xpress SARS-CoV-2/FLU/RSV plus assay is  intended as an aid in the diagnosis of influenza from Nasopharyngeal swab specimens and should not be used as a sole basis for treatment. Nasal washings and aspirates are unacceptable for Xpert Xpress SARS-CoV-2/FLU/RSV testing.  Fact Sheet for Patients: BloggerCourse.com  Fact Sheet for Healthcare Providers: SeriousBroker.it  This test is not yet approved or cleared by the [place] FDA and has been authorized for detection and/or diagnosis of SARS-CoV-2 by FDA under an Emergency Use Authorization (EUA). This EUA will remain in effect (meaning this test can be used) for the duration of the COVID-19 declaration under Section 564(b)(1) of the Act, 21 U.S.C. section 360bbb-3(b)(1), unless the authorization is terminated or revoked.  Performed at [facility], [address] [place], [place] [address]   Surgical pcr screen     Status: None   Collection Time: 02/28/21  5:33 PM   Specimen: Nasal Mucosa; Nasal Swab  Result Value Ref Range Status   MRSA, PCR NEGATIVE NEGATIVE Final   Staphylococcus aureus NEGATIVE NEGATIVE Final    Comment: (NOTE) The Xpert SA Assay (FDA approved for NASAL specimens in patients 53 years of age and older), is one component of a comprehensive surveillance program. It is not intended to diagnose infection nor to guide or monitor treatment. Performed at [facility], [address] [place], [place] [address]   Resp Panel by RT-PCR (Flu A&B, Covid) Nasopharyngeal Swab     Status: None   Collection Time: 03/04/21  9:30 AM   Specimen: Nasopharyngeal Swab; Nasopharyngeal(NP) swabs in vial transport medium  Result Value Ref Range Status   SARS Coronavirus 2 by RT PCR NEGATIVE NEGATIVE Final    Comment: (NOTE) SARS-CoV-2 target nucleic acids are NOT DETECTED.  The SARS-CoV-2 RNA is generally detectable in upper respiratory specimens during the acute phase of infection. The  lowest concentration of SARS-CoV-2 viral copies this assay can detect is 138 copies/mL. A negative result does not preclude SARS-Cov-2 infection and should not be used as the sole basis for treatment or other patient management decisions. A negative result may occur with  improper specimen collection/handling, submission of specimen other than nasopharyngeal swab, presence of viral mutation(s) within the areas targeted by this assay, and inadequate number of viral copies(<138 copies/mL). A negative result must be combined with clinical observations, patient history, and epidemiological information. The expected result is Negative.  Fact Sheet for Patients:  BloggerCourse.com  Fact Sheet for Healthcare Providers:  SeriousBroker.it  This test is no t yet approved or cleared by the [place] FDA and  has been authorized for detection and/or diagnosis of SARS-CoV-2 by FDA under an Emergency Use Authorization (EUA). This EUA will remain  in effect (meaning this test can be used) for the duration of the COVID-19 declaration under Section 564(b)(1) of the Act, 21 U.S.C.section 360bbb-3(b)(1), unless the authorization is terminated  or revoked sooner.       Influenza A by PCR NEGATIVE NEGATIVE Final   Influenza B by PCR NEGATIVE NEGATIVE Final  Comment: (NOTE) The Xpert Xpress SARS-CoV-2/FLU/RSV plus assay is intended as an aid in the diagnosis of influenza from Nasopharyngeal swab specimens and should not be used as a sole basis for treatment. Nasal washings and aspirates are unacceptable for Xpert Xpress SARS-CoV-2/FLU/RSV testing.  Fact Sheet for Patients: EntrepreneurPulse.com.au  Fact Sheet for Healthcare Providers: IncredibleEmployment.be  This test is not yet approved or cleared by the [place] FDA and has been authorized for detection and/or diagnosis of SARS-CoV-2 by FDA under  an Emergency Use Authorization (EUA). This EUA will remain in effect (meaning this test can be used) for the duration of the COVID-19 declaration under Section 564(b)(1) of the Act, 21 U.S.C. section 360bbb-3(b)(1), unless the authorization is terminated or revoked.  Performed at [facility], [place] [address] [place], [place] [address]     Labs: CBC: Recent Labs  Lab 02/28/21 1016 02/28/21 2359 03/02/21 0038 03/03/21 0113 03/04/21 0239  WBC 18.1* 17.1* 11.8* 9.2 10.3  NEUTROABS 15.9*  --   --   --   --   HGB 12.6 10.9* 10.5* 9.5* 9.7*  HCT 37.8 33.8* 32.1* 28.9* 28.8*  MCV 95.0 96.0 95.5 95.1 95.0  PLT 231 224 232 243 [id number]   Basic Metabolic Panel: Recent Labs  Lab 02/26/21 1308 02/28/21 1016 02/28/21 1654 02/28/21 2359 03/02/21 0038 03/04/21 0239  NA 134* 136  --  134* 133* 138  K 3.5 3.3*  --  3.7 3.7 3.4*  CL 98 100  --  103 102 105  CO2 26 25  --  25 21* 25  GLUCOSE 128* 139*  --  131* 120* 110*  BUN 15 23  --  26* 28* 18  CREATININE 0.82 1.50*  --  1.28* 1.11* 0.89  CALCIUM 8.9 8.6*  --  8.0* 7.9* 7.8*  MG  --   --  1.6*  --   --   --    Liver Function Tests: No results for input(s): AST, ALT, ALKPHOS, BILITOT, PROT, ALBUMIN in the last 168 hours. CBG: Recent Labs  Lab 02/26/21 1305  GLUCAP 148*    Discharge time spent: less than 30 minutes.  Signed:  Annita Brod MD.  [facility] 03/04/2021

## 2021-03-04 NOTE — TOC Transition Note (Signed)
Transition of Care [facility]) - CM/SW Discharge Note   Patient Details  Name: Colleen Garrison MRN: [id number] Date of Birth: Oct 30, 1931  [facility] CM/SW Contact:  Jimmy Picket, LCSW Phone Number: 03/04/2021, 1:40 PM   Clinical Narrative:     Per MD patient ready for DC to [facility]. RN, patient, patient's family, and facility notified of DC. Discharge Summary and FL2 sent to facility. DC packet on chart. Insurance Berk[employer] Harvey has been received and pt is covid negative. Ambulance transport requested for patient.    RN to call report to [phone].  CSW will sign off for now as social work intervention is no longer needed. Please consult [place] again if new needs arise.   Final next level of care: Skilled Nursing Facility Barriers to Discharge: Barriers Resolved   Patient Goals and CMS Choice Patient states their goals for this hospitalization and ongoing recovery are:: snf CMS Medicare.gov Compare Post Acute Care list provided to:: Patient Choice offered to / list presented to : Adult Children, Patient  Discharge Placement              Patient chooses bed at: [facility] Patient to be transferred to facility by: ptar Name of family member notified: daughter- bobbie Patient and family notified of of transfer: 03/04/21  Discharge Plan and Services                                     Social Determinants of Health (SDOH) Interventions     Readmission Risk Interventions No flowsheet data found.  Jimmy Picket, LCSW Clinical Social Worker

## 2021-03-04 NOTE — Progress Notes (Signed)
Occupational Therapy Treatment Patient Details Name: Colleen Garrison MRN: [id number] DOB: 1932/01/12 Today's Date: 03/04/2021   History of present illness Pt is an 87 y/o female admitted after falling out of bed in which she sustained a R distal femur fx. She is s/p IM nail, NWB R LE. PMH including but not limited mild dementia.   OT comments  Pt mod-max A for ADLs this session, min guard for bed mobility requiring increased time, max A +2 for transfers. Pt required frequent cuing to adhere to NWB precautions during stand-pivot transfer, despite having therapist foot beneath pt's foot to ensure WB precautions. Reviewed precautions with pt, pt did not recall NWB. Pt presenting with impairments listed below, will continue to follow acutely. Continue to recommend SNF at d/c.   Recommendations for follow up therapy are one component of a multi-disciplinary discharge planning process, led by the attending physician.  Recommendations may be updated based on patient status, additional functional criteria and insurance authorization.    Follow Up Recommendations  Skilled nursing-short term rehab (<3 hours/day)    Assistance Recommended at Discharge Frequent or constant Supervision/Assistance  Equipment Recommendations  None recommended by OT;Other (comment) (defer to next venue of care)    Recommendations for Other Services PT consult    Precautions / Restrictions Precautions Precautions: Fall Restrictions Weight Bearing Restrictions: Yes RLE Weight Bearing: Non weight bearing Other Position/Activity Restrictions: Memory deficits for NWB. Needs frequent reminders       Mobility Bed Mobility Overal bed mobility: Needs Assistance Bed Mobility: Supine to Sit;Sit to Supine     Supine to sit: Min guard     General bed mobility comments: Mod A for trunk elevation and LE assist. Increased time required.    Transfers Overall transfer level: Needs assistance Equipment used: Rolling  walker (2 wheels) Transfers: Sit to/from Stand Sit to Stand: Max assist;+2 physical assistance           General transfer comment: pt required OT foot under pt's foot to adhere to NWB precautions, required constant cues to shift to LLE     Balance Overall balance assessment: Needs assistance;History of Falls Sitting-balance support: Feet supported Sitting balance-Leahy Scale: Fair     Standing balance support: Bilateral upper extremity supported;Reliant on assistive device for balance;During functional activity Standing balance-Leahy Scale: Poor                             ADL either performed or assessed with clinical judgement   ADL Overall ADL's : Needs assistance/impaired                         Toilet Transfer: Maximal assistance;+2 for physical assistance Toilet Transfer Details (indicate cue type and reason): simulated pivoting to chair         Functional mobility during ADLs: Moderate assistance;Maximal assistance;Rolling walker (2 wheels);Cueing for safety;Cueing for sequencing General ADL Comments: Frequent cues for NWB to RLE due to pt's memory impairment.    Extremity/Trunk Assessment Upper Extremity Assessment Upper Extremity Assessment: Generalized weakness   Lower Extremity Assessment Lower Extremity Assessment: Defer to PT evaluation        Vision   Vision Assessment?: No apparent visual deficits   Perception Perception Perception: Not tested   Praxis Praxis Praxis: Not tested    Cognition Arousal/Alertness: Awake/alert Behavior During Therapy: WFL for tasks assessed/performed Overall Cognitive Status: History of cognitive impairments - at baseline  Memory: Decreased recall of precautions;Decreased short-term memory Following Commands: Follows one step commands consistently;Follows one step commands with increased time Safety/Judgement: Decreased awareness of deficits Awareness:  Emergent              Exercises     Shoulder Instructions       General Comments family in room during session    Pertinent Vitals/ Pain       Pain Assessment: No/denies pain Pain Location: R LE Pain Descriptors / Indicators: Grimacing;Guarding;Sore Pain Intervention(s): Limited activity within patient's tolerance;Monitored during session;Repositioned  Home Living                                          Prior Functioning/Environment              Frequency  Min 2X/week        Progress Toward Goals  OT Goals(current goals can now be found in the care plan section)  Progress towards OT goals: Progressing toward goals  Acute Rehab OT Goals Patient Stated Goal: none stated OT Goal Formulation: With patient/family Time For Goal Achievement: 03/15/21 Potential to Achieve Goals: Good ADL Goals Pt Will Perform Lower Body Dressing: sitting/lateral leans;bed level;with set-up;with supervision Pt Will Transfer to Toilet: with min guard assist;stand pivot transfer;bedside commode Pt Will Perform Toileting - Clothing Manipulation and hygiene: with adaptive equipment;sitting/lateral leans;with supervision Pt/caregiver will Perform Home Exercise Program: Both right and left upper extremity;Increased strength;With Supervision Additional ADL Goal #1: Pt will engage in at least 10 min EOB seated functional activities with good static balance without UE support, no loss of balance, in order to demonstrate improved activity tolerance and balance needed to perform seated ADLs  and preparation for transfers safely at home.  Plan Discharge plan remains appropriate;Frequency remains appropriate    Co-evaluation                 AM-PAC OT "6 Clicks" Daily Activity     Outcome Measure   Help from another person eating meals?: None Help from another person taking care of personal grooming?: A Little Help from another person toileting, which includes using  toliet, bedpan, or urinal?: Total Help from another person bathing (including washing, rinsing, drying)?: A Lot Help from another person to put on and taking off regular upper body clothing?: A Little Help from another person to put on and taking off regular lower body clothing?: Total 6 Click Score: 14    End of Session Equipment Utilized During Treatment: Gait belt;Rolling walker (2 wheels)  OT Visit Diagnosis: Unsteadiness on feet (R26.81);Pain;History of falling (Z91.81);Other symptoms and signs involving cognitive function;Muscle weakness (generalized) (M62.81);Other abnormalities of gait and mobility (R26.89) Pain - Right/Left: Right Pain - part of body: Leg   Activity Tolerance Patient tolerated treatment well   Patient Left in chair;with call bell/phone within reach;with chair alarm set;with family/visitor present   Nurse Communication Mobility status        Time: 5449-2010 OT Time Calculation (min): 13 min  Charges: OT General Charges $OT Visit: 1 Visit OT Treatments $Self Care/Home Management : 8-22 mins  Alfonzo Beers, OTD, OTR/L Acute Rehab [phone]   Rolm Gala Alhaji Mcneal 03/04/2021, 2:09 PM

## 2021-03-04 NOTE — Progress Notes (Signed)
Mobility Specialist Progress Note:   03/04/21 1632  Mobility  Activity Transferred:  Chair to bed  Level of Assistance Moderate assist, patient does 50-74% (+2)  Assistive Device Front wheel walker  RLE Weight Bearing NWB  Distance Ambulated (ft) 2 ft  Mobility Response Tolerated well  Mobility performed by Mobility specialist;Nurse  $Mobility charge 1 Mobility   Pt requesting to get back in bed. Required modA of 2 to power up on LLE. With foot under pts RLE, pt able to maintain NWB during pivot transfer back to bed. Pt with no complaints during session. Left pt in bed with all needs met.   Nelta Numbers Mobility Specialist  Phone [phone]

## 2021-03-09 NOTE — Anesthesia Postprocedure Evaluation (Signed)
Anesthesia Post Note  Patient: Colleen Garrison  Procedure(s) Performed: INTRAMEDULLARY (IM) RETROGRADE FEMORAL NAILING (Right)     Patient location during evaluation: PACU Anesthesia Type: General Level of consciousness: awake and alert Pain management: pain level controlled Vital Signs Assessment: post-procedure vital signs reviewed and stable Respiratory status: spontaneous breathing, nonlabored ventilation, respiratory function stable and patient connected to nasal cannula oxygen Cardiovascular status: blood pressure returned to baseline and stable Postop Assessment: no apparent nausea or vomiting Anesthetic complications: no   No notable events documented.  Last Vitals:  Vitals:   03/04/21 0914 03/04/21 1558  BP: (!) 141/52 (!) 124/46  Pulse: (!) 57 (!) 58  Resp: 18 18  Temp: 36.8 C 36.4 C  SpO2: 97% 96%    Last Pain:  Vitals:   03/04/21 1558  TempSrc: Oral  PainSc:                  Tiajuana Amass

## 2021-03-17 ENCOUNTER — Other Ambulatory Visit: Payer: Self-pay

## 2021-03-17 ENCOUNTER — Ambulatory Visit: Payer: Medicare HMO | Admitting: Orthopaedic Surgery

## 2021-03-17 ENCOUNTER — Encounter: Payer: Self-pay | Admitting: Orthopaedic Surgery

## 2021-03-17 ENCOUNTER — Ambulatory Visit (INDEPENDENT_AMBULATORY_CARE_PROVIDER_SITE_OTHER): Payer: Medicare HMO

## 2021-03-17 VITALS — BP 115/64 | HR 58

## 2021-03-17 DIAGNOSIS — S72401A Unspecified fracture of lower end of right femur, initial encounter for closed fracture: Secondary | ICD-10-CM

## 2021-03-17 NOTE — Progress Notes (Signed)
° °  Post-Op Visit Note   Patient: Colleen Garrison           Date of Birth: 12/17/1931           MRN: [id number] Visit Date: 03/17/2021 PCP: Loraine Leriche., MD   Assessment & Plan:  Chief Complaint:  Chief Complaint  Patient presents with   Right Hip - Pain, Routine Post Op   Visit Diagnoses:  1. Closed fracture of distal end of right femur, unspecified fracture morphology, initial encounter ([place])     Plan: [name] is approximately 2 weeks status post retrograde IM nail of right supracondylar femur fracture.  She reports no pain.  Currently she is at [facility] but there are plans to go home and and have her son move in with her temporarily.  Her daughter is here with her today.  Right lower extremity surgical incisions are healed.  She is able to perform seated hip flexion and knee extension without any significant pain or discomfort.  She has mild expected postoperative swelling.  X-rays demonstrate stable fixation alignment of the fracture.  [name] is recovering as well as can be expected.  We will have to continue to keep her nonweightbearing for the most part but she can weight-bear on that leg for pivots and transfers.  We will see her back in 4 weeks for two-view x-rays of the right femur.  Follow-Up Instructions: Return in about 4 weeks (around 04/14/2021).   Orders:  Orders Placed This Encounter  Procedures   XR FEMUR, MIN 2 VIEWS RIGHT   No orders of the defined types were placed in this encounter.   Imaging: XR FEMUR, MIN 2 VIEWS RIGHT  Result Date: 03/17/2021 Stable fixation and alignment of supracondylar femur fracture.   PMFS History: Patient Active Problem List   Diagnosis Date Noted   Acute blood loss anemia 03/03/2021   Overweight (BMI 25.0-29.9) 03/01/2021   Rhabdomyolysis 03/01/2021   Acute renal failure superimposed on stage 3a chronic kidney disease ([place]) 02/28/2021   Leukocytosis 02/28/2021   Closed fracture of distal end of right  femur, unspecified fracture morphology, initial encounter ([place]) 02/28/2021   Hypokalemia 02/28/2021   Prolonged QT interval 02/28/2021   Centrilobular emphysema and restrictive lung disease  04/03/2019   Chronic respiratory failure with hypoxia ([place]) 04/03/2019   Nausea 03/08/2019   Dementia ([place]) 03/08/2019   Late onset Alzheimer dementia ([place]) 03/08/2019   Generalized weakness 03/07/2019   Hypothyroidism 02/28/2019   Depression    Insomnia 12/02/2013   Past Medical History:  Diagnosis Date   Depression    Hiatal hernia    Memory loss    Thyroid disease     History reviewed. No pertinent family history.  Past Surgical History:  Procedure Laterality Date   FEMUR IM NAIL Right 02/28/2021   Procedure: INTRAMEDULLARY (IM) RETROGRADE FEMORAL NAILING;  Surgeon: Leandrew Koyanagi, MD;  Location: [place];  Service: Orthopedics;  Laterality: Right;   Social History   Occupational History   Not on file  Tobacco Use   Smoking status: Former   Smokeless tobacco: Never  Vaping Use   Vaping Use: Never used  Substance and Sexual Activity   Alcohol use: No   Drug use: Never   Sexual activity: Not on file

## 2021-03-18 ENCOUNTER — Telehealth: Payer: Self-pay | Admitting: Orthopaedic Surgery

## 2021-03-18 NOTE — Telephone Encounter (Signed)
yes

## 2021-03-18 NOTE — Telephone Encounter (Signed)
Patient's daughter called. She would like a RX for a hospital bed. Her call back number is [phone]

## 2021-03-19 NOTE — Telephone Encounter (Signed)
Rx ready for pick up at the front desk

## 2021-03-26 ENCOUNTER — Telehealth: Payer: Self-pay | Admitting: Orthopaedic Surgery

## 2021-03-26 NOTE — Telephone Encounter (Signed)
Received call from [place] (PT) with [facility] needing weight bearing status for the patient.  The number to contact patient is [phone]

## 2021-03-26 NOTE — Telephone Encounter (Signed)
touchdown

## 2021-03-26 NOTE — Telephone Encounter (Signed)
I left voicemail for Colleen Garrison advising.

## 2021-04-15 ENCOUNTER — Other Ambulatory Visit: Payer: Self-pay

## 2021-04-15 ENCOUNTER — Ambulatory Visit (INDEPENDENT_AMBULATORY_CARE_PROVIDER_SITE_OTHER): Payer: Medicare HMO | Admitting: Orthopaedic Surgery

## 2021-04-15 ENCOUNTER — Ambulatory Visit (INDEPENDENT_AMBULATORY_CARE_PROVIDER_SITE_OTHER): Payer: Medicare HMO

## 2021-04-15 DIAGNOSIS — S72401A Unspecified fracture of lower end of right femur, initial encounter for closed fracture: Secondary | ICD-10-CM

## 2021-04-15 NOTE — Progress Notes (Signed)
° °  Post-Op Visit Note   Patient: Colleen Garrison           Date of Birth: 1931/08/16           MRN: [id number] Visit Date: 04/15/2021 PCP: Loraine Leriche., MD   Assessment & Plan:  Chief Complaint:  Chief Complaint  Patient presents with   Right Leg - Follow-up   Visit Diagnoses:  1. Closed fracture of distal end of right femur, unspecified fracture morphology, initial encounter [facility])     Plan: Patient is a pleasant 88 year old female who comes in today 6 weeks status post right femur retrograde nail, date of surgery 02/28/2021.  She has been doing well.  She is in minimal to no pain.  She has been compliant bearing weight only with transfers.  She has been getting home health physical therapy about once a week.  Examination of her right lower extremity reveals painless hip flexion.  She is able to fully extend her knee and has painless flexion to approximately 95 degrees.  She is neurovascular intact distally.  At this point, she has demonstrated callus formation on x-rays and we feel comfortable allowing her to progress to weightbearing as tolerated.  She has a walker at home which she will use.  She will follow-up with [place] in 6 weeks time for repeat evaluation and x-rays of the right femur.  Call with concerns or questions in the meantime.  Follow-Up Instructions: Return in about 6 weeks (around 05/27/2021).   Orders:  Orders Placed This Encounter  Procedures   XR FEMUR, MIN 2 VIEWS RIGHT   No orders of the defined types were placed in this encounter.   Imaging: No results found.  PMFS History: Patient Active Problem List   Diagnosis Date Noted   Acute blood loss anemia 03/03/2021   Overweight (BMI 25.0-29.9) 03/01/2021   Rhabdomyolysis 03/01/2021   Acute renal failure superimposed on stage 3a chronic kidney disease ([place]) 02/28/2021   Leukocytosis 02/28/2021   Closed fracture of distal end of right femur, unspecified fracture morphology, initial encounter ([place])  02/28/2021   Hypokalemia 02/28/2021   Prolonged QT interval 02/28/2021   Centrilobular emphysema and restrictive lung disease  04/03/2019   Chronic respiratory failure with hypoxia ([place]) 04/03/2019   Nausea 03/08/2019   Dementia ([place]) 03/08/2019   Late onset Alzheimer dementia ([place]) 03/08/2019   Generalized weakness 03/07/2019   Hypothyroidism 02/28/2019   Depression    Insomnia 12/02/2013   Past Medical History:  Diagnosis Date   Depression    Hiatal hernia    Memory loss    Thyroid disease     No family history on file.  Past Surgical History:  Procedure Laterality Date   FEMUR IM NAIL Right 02/28/2021   Procedure: INTRAMEDULLARY (IM) RETROGRADE FEMORAL NAILING;  Surgeon: Leandrew Koyanagi, MD;  Location: [place];  Service: Orthopedics;  Laterality: Right;   Social History   Occupational History   Not on file  Tobacco Use   Smoking status: Former   Smokeless tobacco: Never  Vaping Use   Vaping Use: Never used  Substance and Sexual Activity   Alcohol use: No   Drug use: Never   Sexual activity: Not on file

## 2021-07-14 ENCOUNTER — Ambulatory Visit: Payer: Medicare HMO | Admitting: Orthopaedic Surgery

## 2021-07-20 ENCOUNTER — Ambulatory Visit: Payer: Medicare HMO | Admitting: Orthopaedic Surgery

## 2021-07-20 ENCOUNTER — Ambulatory Visit (INDEPENDENT_AMBULATORY_CARE_PROVIDER_SITE_OTHER): Payer: Medicare HMO

## 2021-07-20 ENCOUNTER — Encounter: Payer: Self-pay | Admitting: Orthopaedic Surgery

## 2021-07-20 DIAGNOSIS — S72401G Unspecified fracture of lower end of right femur, subsequent encounter for closed fracture with delayed healing: Secondary | ICD-10-CM

## 2021-07-20 DIAGNOSIS — S72401A Unspecified fracture of lower end of right femur, initial encounter for closed fracture: Secondary | ICD-10-CM

## 2021-07-20 NOTE — Progress Notes (Signed)
? ?Office Visit Note ?  ?Patient: Colleen Garrison           ?Date of Birth: 02/13/1932           ?MRN: [id number] ?Visit Date: 07/20/2021 ?             ?Requested by: Loraine Leriche., MD ?[address] ?[place],  [place] [address] ?PCP: Loraine Leriche., MD ? ? ?Assessment & Plan: ?Visit Diagnoses:  ?1. Closed fracture of distal end of right femur with delayed healing, unspecified fracture morphology, subsequent encounter   ? ? ?Plan: Colleen Garrison is now nearly 5 months status post retrograde nail of right supracondylar femur fracture with delayed healing.  We will order a bone stimulator.  Continue activity as tolerated.  Permanent handicap placard provided today.  Recheck in 3 months with two-view x-rays of the right femur. ? ?Follow-Up Instructions: Return in about 3 months (around 10/20/2021).  ? ?Orders:  ?Orders Placed This Encounter  ?Procedures  ? XR FEMUR, MIN 2 VIEWS RIGHT  ? ?No orders of the defined types were placed in this encounter. ? ? ? ? Procedures: ?No procedures performed ? ? ?Clinical Data: ?No additional findings. ? ? ?Subjective: ?Chief Complaint  ?Patient presents with  ? Right Leg - Fracture, Follow-up  ? ? ?HPI ? ?Colleen Garrison is nearly 5 months status post retrograde nail right femur fracture.  Currently uses rollator and walking limited distances at home and sometimes in public.  Reports some swelling and soreness with activity. ? ?Review of Systems  ?Constitutional: Negative.   ?HENT: Negative.    ?Eyes: Negative.   ?Respiratory: Negative.    ?Cardiovascular: Negative.   ?Endocrine: Negative.   ?Musculoskeletal: Negative.   ?Neurological: Negative.   ?Hematological: Negative.   ?Psychiatric/Behavioral: Negative.    ?All other systems reviewed and are negative. ? ? ?Objective: ?Vital Signs: There were no vitals taken for this visit. ? ?Physical Exam ?Vitals and nursing note reviewed.  ?Constitutional:   ?   Appearance: She is well-developed.  ?Pulmonary:  ?   Effort: Pulmonary effort is  normal.  ?Skin: ?   General: Skin is warm.  ?   Capillary Refill: Capillary refill takes less than 2 seconds.  ?Neurological:  ?   Mental Status: She is alert and oriented to person, place, and time.  ?Psychiatric:     ?   Behavior: Behavior normal.     ?   Thought Content: Thought content normal.     ?   Judgment: Judgment normal.  ? ? ?Ortho Exam ? ?Examination of the right lower extremity shows full healed surgical scars.  Crepitus with range of motion.  Trace effusion of the right knee.  Ambulates at a slow gait with a rollator. ? ?Specialty Comments:  ?No specialty comments available. ? ?Imaging: ?XR FEMUR, MIN 2 VIEWS RIGHT ? ?Result Date: 07/20/2021 ?Stable intramedullary nail.  Fracture alignment is stable.  There is evidence of callus formation.  Presence of fracture lucency.  ? ? ?PMFS History: ?Patient Active Problem List  ? Diagnosis Date Noted  ? Acute blood loss anemia 03/03/2021  ? Overweight (BMI 25.0-29.9) 03/01/2021  ? Rhabdomyolysis 03/01/2021  ? Acute renal failure superimposed on stage 3a chronic kidney disease ([place]) 02/28/2021  ? Leukocytosis 02/28/2021  ? Closed fracture of distal end of right femur, unspecified fracture morphology, initial encounter ([place]) 02/28/2021  ? Hypokalemia 02/28/2021  ? Prolonged QT interval 02/28/2021  ? Centrilobular emphysema and restrictive lung disease  04/03/2019  ? Chronic  respiratory failure with hypoxia ([place]) 04/03/2019  ? Nausea 03/08/2019  ? Dementia ([place]) 03/08/2019  ? Late onset Alzheimer dementia ([place]) 03/08/2019  ? Generalized weakness 03/07/2019  ? Hypothyroidism 02/28/2019  ? Depression   ? Insomnia 12/02/2013  ? ?Past Medical History:  ?Diagnosis Date  ? Depression   ? Hiatal hernia   ? Memory loss   ? Thyroid disease   ?  ?History reviewed. No pertinent family history.  ?Past Surgical History:  ?Procedure Laterality Date  ? FEMUR IM NAIL Right 02/28/2021  ? Procedure: INTRAMEDULLARY (IM) RETROGRADE FEMORAL NAILING;  Surgeon: Leandrew Koyanagi, MD;   Location: [place];  Service: Orthopedics;  Laterality: Right;  ? ?Social History  ? ?Occupational History  ? Not on file  ?Tobacco Use  ? Smoking status: Former  ? Smokeless tobacco: Never  ?Vaping Use  ? Vaping Use: Never used  ?Substance and Sexual Activity  ? Alcohol use: No  ? Drug use: Never  ? Sexual activity: Not on file  ? ? ? ? ? ? ?

## 2021-10-19 ENCOUNTER — Ambulatory Visit: Payer: Medicare HMO | Admitting: Orthopaedic Surgery

## 2021-10-26 ENCOUNTER — Ambulatory Visit: Payer: Medicare HMO | Admitting: Orthopaedic Surgery

## 2021-11-18 ENCOUNTER — Ambulatory Visit (INDEPENDENT_AMBULATORY_CARE_PROVIDER_SITE_OTHER): Payer: Medicare HMO

## 2021-11-18 ENCOUNTER — Ambulatory Visit: Payer: Medicare HMO | Admitting: Orthopaedic Surgery

## 2021-11-18 ENCOUNTER — Encounter: Payer: Self-pay | Admitting: Orthopaedic Surgery

## 2021-11-18 DIAGNOSIS — S72401G Unspecified fracture of lower end of right femur, subsequent encounter for closed fracture with delayed healing: Secondary | ICD-10-CM | POA: Diagnosis not present

## 2021-11-18 NOTE — Progress Notes (Signed)
   Post-Op Visit Note   Patient: Colleen Garrison           Date of Birth: 25-Sep-1931           MRN: [id number] Visit Date: 11/18/2021 PCP: Cheron Schaumann., MD   Assessment & Plan:  Chief Complaint:  Chief Complaint  Patient presents with   Right Hip - Follow-up    S/p right femoral nailing   Visit Diagnoses:  1. Closed fracture of distal end of right femur with delayed healing, unspecified fracture morphology, subsequent encounter     Plan: Patient is a pleasant 88 year old female who comes in today 8 months status post right knee retrograde femoral nail from a supracondylar femur fracture, date of surgery 02/28/2021.  She has been doing well.  She is ambulating with her walker without any issues.  No pain noted.  She has not been compliant using her bone stimulator.  Examination of her right knee reveals painless range of motion from 0 to 115 degrees.  She is neurovascular intact distally.  At this point, she is clinically and radiographically improving.  I would like for her to continue wearing her bone stimulator is much as possible.  She will follow-up with [place] in 4 months for repeat evaluation and x-rays of the right femur in anticipation of releasing her at that point.  She will call with concerns or questions in the interim.  Follow-Up Instructions: Return in about 4 months (around 03/20/2022).   Orders:  Orders Placed This Encounter  Procedures   XR FEMUR, MIN 2 VIEWS RIGHT   No orders of the defined types were placed in this encounter.   Imaging: XR FEMUR, MIN 2 VIEWS RIGHT  Result Date: 11/18/2021 X-rays demonstrate continued fracture healing particularly the medial cortex.  There is still evidence of fracture lucency to the lateral cortex.   PMFS History: Patient Active Problem List   Diagnosis Date Noted   Acute blood loss anemia 03/03/2021   Overweight (BMI 25.0-29.9) 03/01/2021   Rhabdomyolysis 03/01/2021   Acute renal failure superimposed on stage 3a  chronic kidney disease ([facility]) 02/28/2021   Leukocytosis 02/28/2021   Closed fracture of distal end of right femur, unspecified fracture morphology, initial encounter ([facility]) 02/28/2021   Hypokalemia 02/28/2021   Prolonged QT interval 02/28/2021   Centrilobular emphysema and restrictive lung disease  04/03/2019   Chronic respiratory failure with hypoxia ([facility]) 04/03/2019   Nausea 03/08/2019   Dementia ([facility]) 03/08/2019   Late onset Alzheimer dementia ([facility]) 03/08/2019   Generalized weakness 03/07/2019   Hypothyroidism 02/28/2019   Depression    Insomnia 12/02/2013   Past Medical History:  Diagnosis Date   Depression    Hiatal hernia    Memory loss    Thyroid disease     No family history on file.  Past Surgical History:  Procedure Laterality Date   FEMUR IM NAIL Right 02/28/2021   Procedure: INTRAMEDULLARY (IM) RETROGRADE FEMORAL NAILING;  Surgeon: Tarry Kos, MD;  [facility];  Service: Orthopedics;  Laterality: Right;   Social History   Occupational History   Not on file  Tobacco Use   Smoking status: Former   Smokeless tobacco: Never  Vaping Use   Vaping Use: Never used  Substance and Sexual Activity   Alcohol use: No   Drug use: Never   Sexual activity: Not on file

## 2022-03-22 ENCOUNTER — Ambulatory Visit (INDEPENDENT_AMBULATORY_CARE_PROVIDER_SITE_OTHER): Payer: Medicare HMO

## 2022-03-22 ENCOUNTER — Ambulatory Visit: Payer: Medicare HMO | Admitting: Orthopaedic Surgery

## 2022-03-22 DIAGNOSIS — S72401A Unspecified fracture of lower end of right femur, initial encounter for closed fracture: Secondary | ICD-10-CM

## 2022-03-22 DIAGNOSIS — S72401G Unspecified fracture of lower end of right femur, subsequent encounter for closed fracture with delayed healing: Secondary | ICD-10-CM

## 2022-03-22 NOTE — Progress Notes (Signed)
   Office Visit Note   Patient: Colleen Garrison           Date of Birth: Feb 21, 1932           MRN: [id number] Visit Date: 03/22/2022              Requested by: Loraine Leriche., MD [address] [place],  [place] [address] PCP: Loraine Leriche., MD   Assessment & Plan: Visit Diagnoses:  1. Closed fracture of distal end of right femur with delayed healing, unspecified fracture morphology, subsequent encounter     Plan: At this point her x-rays have demonstrated radiographic healing.  Clinically she is doing well.  I will release her at this point.  Activity as tolerated.  Follow-Up Instructions: No follow-ups on file.   Orders:  Orders Placed This Encounter  Procedures   XR FEMUR, MIN 2 VIEWS RIGHT   No orders of the defined types were placed in this encounter.     Procedures: No procedures performed   Clinical Data: No additional findings.   Subjective: Chief Complaint  Patient presents with   Right Leg - Pain    HPI Colleen Garrison is now about 13 months status post right supracondylar femur fracture with retrograde nail.  She comes in for follow-up.  She is doing well overall and reports no pain.  Takes occasional Tylenol arthritis for the knee.  Review of Systems   Objective: Vital Signs: There were no vitals taken for this visit.  Physical Exam  Ortho Exam Examination shows that she ambulates with a rolling walker.  She has no tenderness to palpation of the thigh.  Fully healed surgical scars. Specialty Comments:  No specialty comments available.  Imaging: XR FEMUR, MIN 2 VIEWS RIGHT  Result Date: 03/22/2022 X-rays demonstrate significant consolidation to the distal femur fracture.  No hardware complications.    PMFS History: Patient Active Problem List   Diagnosis Date Noted   Acute blood loss anemia 03/03/2021   Overweight (BMI 25.0-29.9) 03/01/2021   Rhabdomyolysis 03/01/2021   Acute renal failure superimposed on stage 3a chronic kidney  disease ([place]) 02/28/2021   Leukocytosis 02/28/2021   Closed fracture of distal end of right femur, unspecified fracture morphology, initial encounter ([place]) 02/28/2021   Hypokalemia 02/28/2021   Prolonged QT interval 02/28/2021   Centrilobular emphysema and restrictive lung disease  04/03/2019   Chronic respiratory failure with hypoxia ([place]) 04/03/2019   Nausea 03/08/2019   Dementia ([place]) 03/08/2019   Late onset Alzheimer dementia ([place]) 03/08/2019   Generalized weakness 03/07/2019   Hypothyroidism 02/28/2019   Depression    Insomnia 12/02/2013   Past Medical History:  Diagnosis Date   Depression    Hiatal hernia    Memory loss    Thyroid disease     No family history on file.  Past Surgical History:  Procedure Laterality Date   FEMUR IM NAIL Right 02/28/2021   Procedure: INTRAMEDULLARY (IM) RETROGRADE FEMORAL NAILING;  Surgeon: Leandrew Koyanagi, MD;  Location: [facility];  Service: Orthopedics;  Laterality: Right;   Social History   Occupational History   Not on file  Tobacco Use   Smoking status: Former   Smokeless tobacco: Never  Vaping Use   Vaping Use: Never used  Substance and Sexual Activity   Alcohol use: No   Drug use: Never   Sexual activity: Not on file
